# Patient Record
Sex: Male | Born: 1950 | Race: White | Hispanic: No | Marital: Married | Smoking: Never smoker
Health system: Southern US, Community
[De-identification: ages and names within clinical notes are randomized; demographics above are authoritative.]

## PROBLEM LIST (undated history)

## (undated) DIAGNOSIS — K279 Peptic ulcer, site unspecified, unspecified as acute or chronic, without hemorrhage or perforation: Secondary | ICD-10-CM

## (undated) DIAGNOSIS — R7303 Prediabetes: Secondary | ICD-10-CM

## (undated) DIAGNOSIS — H9190 Unspecified hearing loss, unspecified ear: Secondary | ICD-10-CM

## (undated) DIAGNOSIS — I10 Essential (primary) hypertension: Secondary | ICD-10-CM

## (undated) DIAGNOSIS — E669 Obesity, unspecified: Secondary | ICD-10-CM

## (undated) DIAGNOSIS — F329 Major depressive disorder, single episode, unspecified: Secondary | ICD-10-CM

## (undated) DIAGNOSIS — H9319 Tinnitus, unspecified ear: Secondary | ICD-10-CM

## (undated) DIAGNOSIS — M1990 Unspecified osteoarthritis, unspecified site: Secondary | ICD-10-CM

## (undated) DIAGNOSIS — R221 Localized swelling, mass and lump, neck: Secondary | ICD-10-CM

## (undated) DIAGNOSIS — N400 Enlarged prostate without lower urinary tract symptoms: Secondary | ICD-10-CM

## (undated) DIAGNOSIS — E785 Hyperlipidemia, unspecified: Secondary | ICD-10-CM

## (undated) DIAGNOSIS — D649 Anemia, unspecified: Secondary | ICD-10-CM

## (undated) DIAGNOSIS — Z87898 Personal history of other specified conditions: Secondary | ICD-10-CM

## (undated) DIAGNOSIS — Z87448 Personal history of other diseases of urinary system: Secondary | ICD-10-CM

## (undated) DIAGNOSIS — G4733 Obstructive sleep apnea (adult) (pediatric): Secondary | ICD-10-CM

## (undated) DIAGNOSIS — Z862 Personal history of diseases of the blood and blood-forming organs and certain disorders involving the immune mechanism: Secondary | ICD-10-CM

## (undated) DIAGNOSIS — F32A Depression, unspecified: Secondary | ICD-10-CM

## (undated) DIAGNOSIS — K7689 Other specified diseases of liver: Secondary | ICD-10-CM

## (undated) DIAGNOSIS — J309 Allergic rhinitis, unspecified: Secondary | ICD-10-CM

## (undated) DIAGNOSIS — Z973 Presence of spectacles and contact lenses: Secondary | ICD-10-CM

## (undated) DIAGNOSIS — G5600 Carpal tunnel syndrome, unspecified upper limb: Secondary | ICD-10-CM

## (undated) DIAGNOSIS — N210 Calculus in bladder: Secondary | ICD-10-CM

## (undated) HISTORY — PX: COLONOSCOPY: SHX174

## (undated) HISTORY — DX: Tinnitus, unspecified ear: H93.19

## (undated) HISTORY — DX: Hyperlipidemia, unspecified: E78.5

## (undated) HISTORY — DX: Obesity, unspecified: E66.9

## (undated) HISTORY — DX: Major depressive disorder, single episode, unspecified: F32.9

## (undated) HISTORY — DX: Peptic ulcer, site unspecified, unspecified as acute or chronic, without hemorrhage or perforation: K27.9

## (undated) HISTORY — PX: CARPAL TUNNEL RELEASE: SHX101

## (undated) HISTORY — PX: DENTAL SURGERY: SHX609

## (undated) HISTORY — DX: Carpal tunnel syndrome, unspecified upper limb: G56.00

## (undated) HISTORY — DX: Allergic rhinitis, unspecified: J30.9

## (undated) HISTORY — PX: BICEPS TENDON REPAIR: SHX566

## (undated) HISTORY — PX: UPPER GI ENDOSCOPY: SHX6162

## (undated) HISTORY — DX: Obstructive sleep apnea (adult) (pediatric): G47.33

## (undated) HISTORY — PX: TONSILLECTOMY: SUR1361

## (undated) HISTORY — DX: Essential (primary) hypertension: I10

## (undated) HISTORY — DX: Depression, unspecified: F32.A

## (undated) HISTORY — PX: LIPOMA EXCISION: SHX5283

## (undated) HISTORY — PX: ACHILLES TENDON REPAIR: SUR1153

---

## 2004-06-26 ENCOUNTER — Ambulatory Visit (HOSPITAL_COMMUNITY): Admission: RE | Admit: 2004-06-26 | Discharge: 2004-06-26 | Payer: Self-pay | Admitting: Gastroenterology

## 2007-10-13 ENCOUNTER — Emergency Department (HOSPITAL_COMMUNITY): Admission: EM | Admit: 2007-10-13 | Discharge: 2007-10-13 | Payer: Self-pay | Admitting: Emergency Medicine

## 2007-10-16 ENCOUNTER — Ambulatory Visit: Admission: RE | Admit: 2007-10-16 | Discharge: 2007-10-16 | Payer: Self-pay | Admitting: Family Medicine

## 2007-10-16 ENCOUNTER — Encounter (INDEPENDENT_AMBULATORY_CARE_PROVIDER_SITE_OTHER): Payer: Self-pay | Admitting: Family Medicine

## 2007-10-16 ENCOUNTER — Ambulatory Visit (HOSPITAL_COMMUNITY): Admission: RE | Admit: 2007-10-16 | Discharge: 2007-10-16 | Payer: Self-pay | Admitting: Family Medicine

## 2007-10-16 ENCOUNTER — Ambulatory Visit: Payer: Self-pay | Admitting: Vascular Surgery

## 2010-08-18 ENCOUNTER — Emergency Department (INDEPENDENT_AMBULATORY_CARE_PROVIDER_SITE_OTHER): Payer: 59

## 2010-08-18 ENCOUNTER — Emergency Department (HOSPITAL_BASED_OUTPATIENT_CLINIC_OR_DEPARTMENT_OTHER)
Admission: EM | Admit: 2010-08-18 | Discharge: 2010-08-18 | Disposition: A | Discharge status: Home / Self Care | Payer: 59 | Attending: Emergency Medicine | Admitting: Emergency Medicine

## 2010-08-18 DIAGNOSIS — Z79899 Other long term (current) drug therapy: Secondary | ICD-10-CM | POA: Insufficient documentation

## 2010-08-18 DIAGNOSIS — R509 Fever, unspecified: Secondary | ICD-10-CM

## 2010-08-18 DIAGNOSIS — E7800 Pure hypercholesterolemia, unspecified: Secondary | ICD-10-CM | POA: Insufficient documentation

## 2010-08-18 DIAGNOSIS — I10 Essential (primary) hypertension: Secondary | ICD-10-CM | POA: Insufficient documentation

## 2010-08-18 DIAGNOSIS — J0100 Acute maxillary sinusitis, unspecified: Secondary | ICD-10-CM | POA: Insufficient documentation

## 2010-08-18 LAB — URINALYSIS, ROUTINE W REFLEX MICROSCOPIC
Ketones, ur: NEGATIVE mg/dL
Nitrite: NEGATIVE
Protein, ur: NEGATIVE mg/dL
Urine Glucose, Fasting: NEGATIVE mg/dL

## 2010-08-18 LAB — CBC
HCT: 39.6 % (ref 39.0–52.0)
MCH: 31.6 pg (ref 26.0–34.0)
MCV: 88 fL (ref 78.0–100.0)
Platelets: 207 10*3/uL (ref 150–400)
RDW: 13.4 % (ref 11.5–15.5)

## 2010-08-18 LAB — COMPREHENSIVE METABOLIC PANEL
Albumin: 4.4 g/dL (ref 3.5–5.2)
Alkaline Phosphatase: 65 U/L (ref 39–117)
BUN: 21 mg/dL (ref 6–23)
Chloride: 105 mEq/L (ref 96–112)
Creatinine, Ser: 1.1 mg/dL (ref 0.4–1.5)
Glucose, Bld: 117 mg/dL — ABNORMAL HIGH (ref 70–99)
Potassium: 3.8 mEq/L (ref 3.5–5.1)
Total Bilirubin: 1.3 mg/dL — ABNORMAL HIGH (ref 0.3–1.2)
Total Protein: 7.7 g/dL (ref 6.0–8.3)

## 2010-08-18 LAB — URINE MICROSCOPIC-ADD ON

## 2010-08-18 LAB — LACTIC ACID, PLASMA: Lactic Acid, Venous: 1 mmol/L (ref 0.5–2.2)

## 2010-08-18 LAB — DIFFERENTIAL
Eosinophils Absolute: 0 10*3/uL (ref 0.0–0.7)
Eosinophils Relative: 0 % (ref 0–5)
Lymphocytes Relative: 17 % (ref 12–46)
Lymphs Abs: 1.9 10*3/uL (ref 0.7–4.0)
Monocytes Relative: 9 % (ref 3–12)

## 2010-08-20 LAB — URINE CULTURE
Colony Count: NO GROWTH
Culture  Setup Time: 201202040136
Culture: NO GROWTH

## 2010-08-25 LAB — CULTURE, BLOOD (ROUTINE X 2)
Culture  Setup Time: 201202040151
Culture: NO GROWTH

## 2010-12-01 NOTE — Op Note (Signed)
NAMEFERMAN, BASILIO               ACCOUNT NO.:  [id number]   MEDICAL RECORD NO.:  [id number]          PATIENT TYPE:  AMB   LOCATION:  ENDO                         FACILITY:  [facility]   PHYSICIAN:  Graylin Shiver, M.D.   DATE OF BIRTH:  04-26-1951   DATE OF PROCEDURE:  06/26/2004  DATE OF DISCHARGE:                                 OPERATIVE REPORT   PROCEDURE:  Colonoscopy.   INDICATIONS:  Screening.   Informed consent was obtained after explanation of the risks of bleeding,  infection, and perforation.   PREMEDICATION:  Fentanyl 75 mcg IV, Versed 6 mg IV.   PROCEDURE:  With the patient in the left lateral decubitus position, a  rectal exam was performed.  No masses were felt.  The Olympus colonoscope  was inserted into the rectum and advanced around the colon to the cecum.  Cecal landmarks were identified.  The cecum and ascending colon were normal,  the transverse colon normal, the descending colon, sigmoid, and rectum were  normal.  He tolerated the procedure well without complications.   IMPRESSION:  Normal colonoscopy to the cecum.   I would recommend a follow-up colonoscopy again in 10 years.      SFG/MEDQ  D:  06/26/2004  T:  06/26/2004  Job:  [id number]   cc:   Tally Joe, M.D.  [address]  [place], [place] [address]  Fax: [phone]

## 2011-02-14 ENCOUNTER — Other Ambulatory Visit: Payer: Self-pay

## 2011-02-23 ENCOUNTER — Ambulatory Visit
Admission: RE | Admit: 2011-02-23 | Discharge: 2011-02-23 | Disposition: A | Discharge status: Home / Self Care | Payer: 59 | Source: Ambulatory Visit

## 2011-04-09 LAB — CBC
HCT: 49
Platelets: 261
RDW: 13.2
WBC: 8.3

## 2011-04-09 LAB — ETHANOL: Alcohol, Ethyl (B): <5

## 2011-04-09 LAB — DIFFERENTIAL
Basophils Absolute: 0
Eosinophils Absolute: 0.1
Eosinophils Relative: 1
Lymphocytes Relative: 25
Neutrophils Relative %: 65

## 2011-04-09 LAB — BASIC METABOLIC PANEL
BUN: 13
Creatinine, Ser: 1.18
GFR calc non Af Amer: >60
Glucose, Bld: 115 — ABNORMAL HIGH
Potassium: 4.1

## 2011-04-09 LAB — RAPID URINE DRUG SCREEN, HOSP PERFORMED
Barbiturates: NOT DETECTED
Benzodiazepines: NOT DETECTED
Opiates: NOT DETECTED

## 2013-04-15 ENCOUNTER — Other Ambulatory Visit: Payer: Self-pay | Admitting: General Surgery

## 2013-04-15 ENCOUNTER — Telehealth: Payer: Self-pay | Admitting: Cardiology

## 2013-04-15 NOTE — Telephone Encounter (Signed)
New problem   Pt was referred to a dentist for oral procedure that wasn't in his network. Pleas call pt.

## 2013-04-15 NOTE — Telephone Encounter (Signed)
Left patient a message to call back.

## 2013-04-16 NOTE — Telephone Encounter (Signed)
Spoke with pt and he is willing to stay with the out of network Dentist.

## 2013-12-17 ENCOUNTER — Encounter: Payer: Self-pay | Admitting: *Deleted

## 2013-12-21 ENCOUNTER — Encounter: Payer: Self-pay | Admitting: Neurology

## 2013-12-21 ENCOUNTER — Ambulatory Visit (INDEPENDENT_AMBULATORY_CARE_PROVIDER_SITE_OTHER): Payer: 59 | Admitting: Neurology

## 2013-12-21 VITALS — BP 110/78 | HR 74 | Ht 66.14 in | Wt 200.2 lb

## 2013-12-21 DIAGNOSIS — R209 Unspecified disturbances of skin sensation: Secondary | ICD-10-CM

## 2013-12-21 DIAGNOSIS — G5712 Meralgia paresthetica, left lower limb: Secondary | ICD-10-CM

## 2013-12-21 DIAGNOSIS — G5710 Meralgia paresthetica, unspecified lower limb: Secondary | ICD-10-CM

## 2013-12-21 MED ORDER — LIDOCAINE 5 % EX OINT: TOPICAL_OINTMENT | CUTANEOUS | Status: DC

## 2013-12-21 NOTE — Progress Notes (Signed)
Note faxed.

## 2013-12-21 NOTE — Progress Notes (Signed)
[facility] Clinic Note - Initial Visit   Date: 26/83/4196  Hunter Stone MRN: [id number] DOB: 1950-12-09   Dear Dr Moreen Fowler:   Thank you for your kind referral of Hunter Stone for consultation of paresthesias of arm and legs. Although his history is well known to you, please allow [place] to reiterate it for the purpose of our medical record. The patient was accompanied to the clinic by self.    History of Present Illness: Hunter Stone is a 63 y.o. right-handed Caucasian male with history of hypertension, hyperlipidemia, OSA (intolerant of CPAP), PUD, and depression presenting for evaluation of left meralgia paresthetica and numbness/tingling of the arms.  In early 2014, he developed numbness/tingling of the left anterolateral thigh.  Symptoms were constant and improved within a year and returned again in Spring 2015. He takes Aleve as needed which does not help.  He reports gaining about 3-4 lb in the past 39-months.  Denies wearing tight constrictive clothing.  He does endorse carrying his cell phone on his belt on the right side.    Further, over the past year, he feel as if his arms falls asleep, worse on the right side and at night.  Pain is characterized as shooting and involves the entire arm from the shoulder into the fingers.  He has tried sleeping in different positions, which does not make much of a difference.  He endorses mild neck and shoulder for which he saw a chiropractor, which has since improved.  He has been on lipitor for a number of years which was stopped in May.  Since stopping his statin, his arm pain has significantly improved.  He denied have muscle soreness or weakness, but arms were very painful.    Out-side paper records, electronic medical record, and images have been reviewed where available and summarized as:  Labs 10/20/2013:  HbA1c 6.1, Cr 1.10, BUN 17, AST 29, ALT 42  MRI head wo contrast 10/17/2007: No acute or reversible  process. Changes of small vessel disease in the hemispheric white matter bilaterally, advanced for a patient of this age and likely related to the patient's hypertension.    Past Medical History  Diagnosis Date  . Hypertension   . OSA (obstructive sleep apnea)   . Obesity   . Peptic ulcer disease   . Tinnitus   . Allergic rhinitis   . Depression   . Dyslipidemia     Past Surgical History  Procedure Laterality Date  . Achilles tendon repair Left      Medications:  Current Outpatient Prescriptions on File Prior to Visit  Medication Sig Dispense Refill  . aspirin 81 MG tablet Take by mouth daily.      . cetirizine (ZYRTEC) 10 MG tablet Take 10 mg by mouth daily.      . cholecalciferol (VITAMIN D) 1000 UNITS tablet Take 1,000 Units by mouth daily.      . Cinnamon 500 MG capsule Take 500 mg by mouth daily.      Marland Kitchen co-enzyme Q-10 30 MG capsule Take 30 mg by mouth 3 (three) times daily.      Marland Kitchen olmesartan-hydrochlorothiazide (BENICAR HCT) 20-12.5 MG per tablet Take 1 tablet by mouth daily.      . Omega-3 Fatty Acids (FISH OIL) 1000 MG CPDR Take by mouth.      . Probiotic Product (PROBIOTIC DAILY PO) Take by mouth.       No current facility-administered medications on file prior to visit.  Allergies: No Known Allergies  Family History: Family History  Problem Relation Age of Onset  . Huntington's disease Father     Deceased, 75 Hunter Stone tested negative  . Lung cancer Mother     Deceased 29  . Heart attack Paternal Grandfather   . Coronary artery disease Paternal Grandfather   . Heart attack Maternal Grandfather   . Coronary artery disease Maternal Grandfather   . Healthy Brother     Tested negative for Huntington's    Social History: History   Social History  . Marital Status: Married    Spouse Name: N/A    Number of Children: 3  . Years of Education: N/A   Occupational History  .  [place] History Main Topics  . Smoking status: Never Smoker   .  Smokeless tobacco: Not on file  . Alcohol Use: Yes     Comment: 2 drinks per night (wine, liquor, beer)  . Drug Use: No  . Sexual Activity: Not on file   Other Topics Concern  . Not on file   Social History Narrative   He is a retired Estate manager/land agent.   Lives with wife and son.          Review of Systems:  CONSTITUTIONAL: No fevers, chills, night sweats, or weight loss.   EYES: No visual changes or eye pain ENT: No hearing changes.  No history of nose bleeds.   RESPIRATORY: No cough, wheezing and shortness of breath.   CARDIOVASCULAR: Negative for chest pain, and palpitations.   GI: Negative for abdominal discomfort, blood in stools or black stools.  No recent change in bowel habits.   GU:  No history of incontinence.   MUSCLOSKELETAL: No history of joint pain or swelling.  No myalgias.   SKIN: Negative for lesions, rash, and itching.   HEMATOLOGY/ONCOLOGY: Negative for prolonged bleeding, bruising easily, and swollen nodes.  No history of cancer.   ENDOCRINE: Negative for cold or heat intolerance, polydipsia or goiter.   PSYCH:  No depression or anxiety symptoms.   NEURO: As Above.   Vital Signs:  BP 110/78  Pulse 74  Ht 5' 6.14" (1.68 m)  Wt 200 lb 3 oz (90.804 kg)  BMI 32.17 kg/m2  SpO2 97% Pain Scale: 0 on a scale of 0-10   General Medical Exam:   General:  Well appearing, comfortable.   Eyes/ENT: see cranial nerve examination.   Neck: No masses appreciated.  Full range of motion without tenderness.  No carotid bruits. Respiratory:  Clear to auscultation, good air entry bilaterally.   Cardiac:  Regular rate and rhythm, no murmur.   Extremities:  No deformities, edema, or skin discoloration. Good capillary refill.   Skin:  Skin color, texture, turgor normal. No rashes or lesions.  Neurological Exam: MENTAL STATUS including orientation to time, place, person, recent and remote memory, attention span and concentration, language, and fund of knowledge is  normal.  Speech is not dysarthric.  CRANIAL NERVES: II:  No visual field defects.  Unremarkable fundi.   III-IV-VI: Pupils equal round and reactive to light.  Normal conjugate, extra-ocular eye movements in all directions of gaze.  No nystagmus.  No ptosis.   V:  Normal facial sensation.  VII:  Normal facial symmetry and movements.    VIII:  Normal hearing and vestibular function.   IX-X:  Normal palatal movement.   XI:  Normal shoulder shrug and head rotation.   XII:  Normal tongue strength and range  of motion, no deviation or fasciculation.  MOTOR:  No atrophy, fasciculations or abnormal movements. No pain to muscle palpation. No pronator drift.  Tone is normal.    Right Upper Extremity:    Left Upper Extremity:    Deltoid  5/5   Deltoid  5/5   Biceps  5/5   Biceps  5/5   Triceps  5/5   Triceps  5/5   Wrist extensors  5/5   Wrist extensors  5/5   Wrist flexors  5/5   Wrist flexors  5/5   Finger extensors  5/5   Finger extensors  5/5   Finger flexors  5/5   Finger flexors  5/5   Dorsal interossei  5/5   Dorsal interossei  5/5   Abductor pollicis  5/5   Abductor pollicis  5/5   Tone (Ashworth scale)  0  Tone (Ashworth scale)  0   Right Lower Extremity:    Left Lower Extremity:    Hip flexors  5/5   Hip flexors  5/5   Hip extensors  5/5   Hip extensors  5/5   Knee flexors  5/5   Knee flexors  5/5   Knee extensors  5/5   Knee extensors  5/5   Dorsiflexors  5/5   Dorsiflexors  5/5   Plantarflexors  5/5   Plantarflexors  5/5   Toe extensors  5/5   Toe extensors  5/5   Toe flexors  5/5   Toe flexors  5/5   Tone (Ashworth scale)  0  Tone (Ashworth scale)  0   MSRs:  Right                                                                 Left brachioradialis 2+  brachioradialis 2+  biceps 2+  biceps 2+  triceps 2+  triceps 2+  patellar 2+  patellar 2+  ankle jerk 2+  ankle jerk 2+  Hoffman no  Hoffman no  plantar response down  plantar response down   SENSORY:  Reduced  temperature, pin prick, and light touch over the anterolateral thigh on the left, otherwise normal and symmetric sensory exam of the extremities.  Rhomberg is negative.  COORDINATION/GAIT: Normal finger-to- nose-finger and heel-to-shin.  Intact rapid alternating movements bilaterally.  Able to rise from a chair without using arms.  Gait narrow based and stable. Tandem and stressed gait intact.    IMPRESSION: Hunter Stone is a 63 year-old gentleman presenting for left thigh numbness and episodic paresthesias of the hands.  His examination is consistent with diminished sensation in all modalities over the anterolateral thigh on the left side. 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 Management is conservative with NSAIDs and avoidance of repetitive trauma to this region, specifically minimize wearing his cell phone case on the left belt.  I have discussed the diagnosis, pathophysiology, management plan, and risks and benefits of medications, and prognosis.   Additionally, he had whole arm paresthesias involving the right > left arm which has improved since stopping lipitor. Usually statin cause myalgias and its relationship causing neuropathy is still controversial as there is no data to support this.  If his symptoms worsen, NCS/EMG of the right arm can be performed to help differentiate cervical radiculopathy vs nerve entrapment (CTS, ulnar neuropathy).  Since he is clinically doing well, he would like to see how symptoms evolve over time which is reasonable.   PLAN/RECOMMENDATIONS:  1.  Apply lidocaine ointment to left thigh as needed 2.  Avoid tight fitting clothing, belts, and carrying your cell phone on the belt 3.  Weight loss encouraged 4.  Consider EMG of right arm if symptoms worsen 5.  Return to clinic as needed   The duration of this appointment visit was 45 minutes of face-to-face time  with the patient.  Greater than 50% of this time was spent in counseling, explanation of diagnosis, planning of further management, and coordination of care.   Thank you for allowing me to participate in patient's care.  If I can answer any additional questions, I would be pleased to do so.    Sincerely,    Donika K. Posey Pronto, DO

## 2013-12-21 NOTE — Patient Instructions (Signed)
1.  Apply lidocaine ointment to left thigh as needed 2.  Avoid tight fitting clothing or belts.  Also try to avoid carrying your cell phone on your belt 4.  Weight loss encouraged 5.  Return to clinic as needed, especially if symptoms worsen

## 2014-04-30 ENCOUNTER — Other Ambulatory Visit: Payer: Self-pay | Admitting: Family Medicine

## 2014-04-30 DIAGNOSIS — M5441 Lumbago with sciatica, right side: Secondary | ICD-10-CM

## 2014-05-04 ENCOUNTER — Ambulatory Visit
Admission: RE | Admit: 2014-05-04 | Discharge: 2014-05-04 | Disposition: A | Discharge status: Home / Self Care | Payer: 59 | Source: Ambulatory Visit | Attending: Family Medicine | Admitting: Family Medicine

## 2014-05-04 DIAGNOSIS — M5441 Lumbago with sciatica, right side: Secondary | ICD-10-CM

## 2014-05-27 ENCOUNTER — Telehealth: Payer: Self-pay | Admitting: Neurology

## 2014-05-27 NOTE — Telephone Encounter (Signed)
Pt resch appt from 05-28-14 to 05-31-14

## 2014-05-28 ENCOUNTER — Ambulatory Visit: Payer: 59 | Admitting: Neurology

## 2014-05-31 ENCOUNTER — Ambulatory Visit (INDEPENDENT_AMBULATORY_CARE_PROVIDER_SITE_OTHER): Payer: 59 | Admitting: Neurology

## 2014-05-31 ENCOUNTER — Encounter: Payer: Self-pay | Admitting: Neurology

## 2014-05-31 VITALS — BP 110/70 | HR 80 | Ht 66.0 in | Wt 186.2 lb

## 2014-05-31 DIAGNOSIS — M5431 Sciatica, right side: Secondary | ICD-10-CM

## 2014-05-31 DIAGNOSIS — M5412 Radiculopathy, cervical region: Secondary | ICD-10-CM

## 2014-05-31 DIAGNOSIS — R209 Unspecified disturbances of skin sensation: Secondary | ICD-10-CM

## 2014-05-31 MED ORDER — GABAPENTIN 300 MG PO CAPS: 300.0000 mg | ORAL_CAPSULE | Freq: Every day | ORAL | Status: DC

## 2014-05-31 NOTE — Patient Instructions (Signed)
1.  Start gabapentin 300mg  at bedtime  2.  Physical therapy for leg stretching 3.  EMG left arm and leg 4.  Return to clinic 42-months

## 2014-05-31 NOTE — Progress Notes (Signed)
Follow-up Visit   UTML:46/50/3546   Hunter Stone MRN: [id number] DOB: May 14, 1951   Interim History: Hunter Stone is a 63 y.o. right-handed Caucasian male with history of hypertension, hyperlipidemia, OSA (intolerant of CPAP), PUD, and depression presenting for evaluation of right arm and leg shooting pain.  History of present illness: In early 2014, he developed numbness/tingling of the left anterolateral thigh. Symptoms were constant and improved within a year and returned again in Spring 2015. He takes Aleve as needed which does not help. He reports gaining about 3-4 lb in the past 39-months. Denies wearing tight constrictive clothing. He does endorse carrying his cell phone on his belt on the right side.   Further, over the past year, he feel as if his arms falls asleep, worse on the right side and at night. Pain is characterized as shooting and involves the entire arm from the shoulder into the fingers. He has tried sleeping in different positions, which does not make much of a difference. He endorses mild neck and shoulder for which he saw a chiropractor, which has since improved. He has been on lipitor for a number of years which was stopped in May. Since stopping his statin, his arm pain has significantly improved. He denied have muscle soreness or weakness, but arms were very painful.   UPDATE 05/31/2014:  Around 40-months ago, he developed right leg shooting pain radiating down his leg.  It is worse sitting and alleviates with stretching.   He also reports to having right shoulder pain which had initially improved after stopping his statin, however he reports he has returned and is worse. Pain is characterized as shooting and radiates from his shoulders into the fingers.he has tried taking Aleve, however denies any significant benefit.MRI of the lumbar spine was obtained on his lower extremity paresthesias and did not show any nerve impingement.  Meralgia  paresthetica is slihglty improved.     Medications:  Current Outpatient Prescriptions on File Prior to Visit  Medication Sig Dispense Refill  . aspirin 81 MG tablet Take by mouth daily.    . cetirizine (ZYRTEC) 10 MG tablet Take 10 mg by mouth daily.    . cholecalciferol (VITAMIN D) 1000 UNITS tablet Take 1,000 Units by mouth daily.    . Cinnamon 500 MG capsule Take 500 mg by mouth daily.    Marland Kitchen co-enzyme Q-10 30 MG capsule Take 30 mg by mouth 3 (three) times daily.    Marland Kitchen lidocaine (XYLOCAINE) 5 % ointment Apply to left thigh as needed.  Wear gloves when using. 30 g 0  . olmesartan-hydrochlorothiazide (BENICAR HCT) 20-12.5 MG per tablet Take 1 tablet by mouth daily.    . Omega-3 Fatty Acids (FISH OIL) 1000 MG CPDR Take by mouth.    . Probiotic Product (PROBIOTIC DAILY PO) Take by mouth.     No current facility-administered medications on file prior to visit.    Allergies: No Known Allergies   Review of Systems:  CONSTITUTIONAL: No fevers, chills, night sweats, + weight loss.   EYES: No visual changes or eye pain ENT: No hearing changes.  No history of nose bleeds.   RESPIRATORY: No cough, wheezing and shortness of breath.   CARDIOVASCULAR: Negative for chest pain, and palpitations.   GI: Negative for abdominal discomfort, blood in stools or black stools.  No recent change in bowel habits.   GU:  No history of incontinence.   MUSCLOSKELETAL: No history of joint pain or swelling.  No myalgias.  SKIN: Negative for lesions, rash, and itching.   ENDOCRINE: Negative for cold or heat intolerance, polydipsia or goiter.   PSYCH:  No depression or anxiety symptoms.   NEURO: As Above.   Vital Signs:  BP 110/70 mmHg  Pulse 80  Ht 5\' 6"  (1.676 m)  Wt 186 lb 4 oz (84.482 kg)  BMI 30.08 kg/m2  SpO2 98%  Neurological Exam: MENTAL STATUS including orientation to time, place, person, recent and remote memory, attention span and concentration, language, and fund of knowledge is normal.   Speech is not dysarthric.  CRANIAL NERVES: Pupils equal round and reactive to light.  Normal conjugate, extra-ocular eye movements in all directions of gaze.  No ptosis. Face is symmetric. Palate elevates symmetrically.  Tongue is midline.  MOTOR:  Motor strength is 5/5 in all extremities.  No pronator drift.  Tone is normal.    MSRs:  Reflexes are 2+/4 throughout.  SENSORY:  Intact to vibration and light touch throughout.  COORDINATION/GAIT:  Gait narrow based and stable.   Data: Labs 10/20/2013: HbA1c 6.1, Cr 1.10, BUN 17, AST 29, ALT 42  MRI head wo contrast 10/17/2007: No acute or reversible process. Changes of small vessel disease in the hemispheric white matter bilaterally, advanced for a patient of this age and likely related to the patient's hypertension.  MRI lumbar spine wo contrast 05/04/2014: Mild lumbar degenerative change without neural impingement T12 bone lesion on the right is well circumscribed most likely benign such as hemangioma. Findings suggestive of chronic bladder outlet obstruction.   IMPRESSION/PLAN: 1. Right arm paresthesias with radicular pain, likely due to cervical radiculopathy  - Electrodiagnostic testing to better characterize the nature of his symptoms  - Start gabapentin 300 mg at bedtime, up-titrate as needed  2.  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  Left meralgia paresthetica, improving  - He reports losing 20lbs this fall which has helped  4.  Return to clinic in 2 months   The duration of this appointment visit was 25 minutes of face-to-face time with the patient.  Greater than 50% of this time was spent in counseling, explanation of diagnosis, planning of further management, and coordination of care.   Thank you for allowing me to participate in patient's care.   If I can answer any additional questions, I would be pleased to do so.    Sincerely,    Lauraine Crespo K. Posey Pronto, DO

## 2014-06-02 ENCOUNTER — Telehealth: Payer: Self-pay | Admitting: Neurology

## 2014-06-02 NOTE — Telephone Encounter (Signed)
Pt calling as requested with med update. Please call back at confirmed CB# [phone] / Sherri S.

## 2014-06-02 NOTE — Telephone Encounter (Signed)
Patient given instructions to increase gabapentin and will report back to [place] on how it is working.  Also gave him appointment to [facility].  December 4 at 12:45 with Dr. Doran Durand.  Notes faxed.

## 2014-06-02 NOTE — Telephone Encounter (Signed)
Yes, if it is not making him too sleepy, he can increase to 300 mg 3 times daily.

## 2014-06-02 NOTE — Telephone Encounter (Signed)
Patient called back to report that he is having more pain in his wrist and hand.  He is on Gabapentin 300 mg qhs.  Would you like to increase dose?

## 2014-06-21 NOTE — Telephone Encounter (Signed)
Patient called to clarify why we sent him to PT.  Informed him that it is for leg stretching.

## 2014-07-07 ENCOUNTER — Other Ambulatory Visit: Payer: Self-pay | Admitting: *Deleted

## 2014-07-07 ENCOUNTER — Telehealth: Payer: Self-pay | Admitting: Neurology

## 2014-07-07 ENCOUNTER — Ambulatory Visit (INDEPENDENT_AMBULATORY_CARE_PROVIDER_SITE_OTHER): Payer: 59 | Admitting: Neurology

## 2014-07-07 DIAGNOSIS — M5412 Radiculopathy, cervical region: Secondary | ICD-10-CM

## 2014-07-07 DIAGNOSIS — R209 Unspecified disturbances of skin sensation: Secondary | ICD-10-CM

## 2014-07-07 DIAGNOSIS — G5601 Carpal tunnel syndrome, right upper limb: Secondary | ICD-10-CM

## 2014-07-07 DIAGNOSIS — M5431 Sciatica, right side: Secondary | ICD-10-CM

## 2014-07-07 MED ORDER — GABAPENTIN 300 MG PO CAPS: ORAL_CAPSULE | ORAL | Status: DC

## 2014-07-07 NOTE — Telephone Encounter (Signed)
Rx sent to the pharmacy 300mg  (x) 90 with 3 refills pre Dr. Posey Pronto to increase

## 2014-07-07 NOTE — Telephone Encounter (Signed)
Pt needs to have some gabapentin called into the [facility] [address] he states that we changed his doseage and he is out his phone number is [phone]

## 2014-07-07 NOTE — Procedures (Signed)
[facility]  [address]  [place], [place] [address] Tel: [phone] Fax:  [phone] Test Date:  07/07/2014  Patient: Hunter Stone DOB: [id number] Physician: Narda Amber  Sex: Male Height: 5\' 6"  Ref Phys: Narda Amber  ID#: [id number]   Technician: Laureen Ochs R. NCS T.   Patient Complaints: Patient is a 63 year old male here for evaluation of symptoms of radicular pain in the right arm with paresthesias and in the right leg.  NCV & EMG Findings: Extensive electrodiagnostic testing of the right upper and lower extremity shows:  1. Median sensory response is markedly reduced and prolonged. The ulnar and radial sensory responses are within normal limits. 2. The median motor response shows prolonged latency with preserved amplitude. The ulnar motor responses within normal limits. 3. Sural and superficial peroneal sensory responses are within normal limits. 4. Peroneal and tibial motor responses are within normal limits. 5. In the arms, there is no evidence of active or chronic motor axon loss changes affecting any of the tested muscles.  6. In the legs, sparse chronic motor axon loss changes are seen affecting the tibialis anterior, medial gastrocnemius, biceps femoris short head, and semi-member gnosis muscles. There is no evidence of active changes.  Impression: 1. Right median neuropathy, at or distal to the wrist consistent with the clinical diagnosis of carpal tunnel syndrome. Overall, these findings are moderate to severe in degree electrically. 2. 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 Definitive diagnosis cannot be made as sensory responses are preserved in the lower extremity.    ___________________________ Narda Amber    Nerve Conduction Studies Anti Sensory Summary Table   Stim Site NR Peak  (ms) Norm Peak (ms) P-T Amp (V) Norm P-T Amp  Right Median Anti Sensory (2nd Digit)  32C  Wrist    5.4 <3.8 6.7 >10  Right Radial Anti Sensory (Base 1st Digit)  32C  Wrist    2.0 <2.8 28.5 >10  Right Sup Peroneal Anti Sensory (Ant Lat Mall)  32C  12 cm    2.8 <4.6 9.8 >3  Right Sural Anti Sensory (Lat Mall)  32C  Calf    3.7 <4.6 12.4 >3  Right Ulnar Anti Sensory (5th Digit)  32C  Wrist    2.4 <3.2 24.5 >5   Motor Summary Table   Stim Site NR Onset (ms) Norm Onset (ms) O-P Amp (mV) Norm O-P Amp Site1 Site2 Delta-0 (ms) Dist (cm) Vel (m/s) Norm Vel (m/s)  Right Median Motor (Abd Poll Brev)  32C  Wrist    5.5 <4.0 8.5 >5 Elbow Wrist 4.6 24.5 53 >50  Elbow    10.1  8.3         Right Peroneal Motor (Ext Dig Brev)  32C  Ankle    3.4 <6.0 5.7 >2.5 B Fib Ankle 6.7 30.0 45 >40  B Fib    10.1  5.1  Poplt B Fib 1.5 7.0 47 >40  Poplt    11.6  4.9         Right Peroneal TA Motor (Tib Ant)  32C  Fib Head    1.9 <4.5 11.0 >3 Poplit Fib Head 1.9 8.0 42 >40  Poplit    3.8  10.8         Right Tibial Motor (Abd Hall Brev)  32C  Ankle    4.0 <6.0  10.8 >4 Knee Ankle 8.3 37.0 45 >40  Knee    12.3  9.3         Right Ulnar Motor (Abd Dig Minimi)  32C  Wrist    2.2 <3.1 12.0 >7 B Elbow Wrist 4.1 23.0 56 >50  B Elbow    6.3  11.2  A Elbow B Elbow 2.0 10.0 50 >50  A Elbow    8.3  10.7          F Wave Studies   NR F-Lat (ms) Lat Norm (ms) L-R F-Lat (ms)  Right Tibial (Mrkrs) (Abd Hallucis)  32C     44.43 <55   Right Ulnar (Mrkrs) (Abd Dig Min)  32C     28.38 <33    H Reflex Studies   NR H-Lat (ms) Lat Norm (ms) L-R H-Lat (ms)  Left Tibial (Gastroc)  32C     32.93 <35 0.00  Right Tibial (Gastroc)  32C     32.93 <35 0.00   EMG   Side Muscle Ins Act Fibs Psw Fasc Number Recrt Dur Dur. Amp Amp. Poly Poly. Comment  Right 1stDorInt Nml Nml Nml Nml Nml Nml Nml Nml Nml Nml Nml Nml N/A  Right Abd Poll Brev Nml Nml Nml Nml Nml Nml Nml Nml Nml Nml Nml Nml N/A  Right FlexPolLong Nml Nml  Nml Nml Nml Nml Nml Nml Nml Nml Nml Nml N/A  Right PronatorTeres Nml Nml Nml Nml Nml Nml Nml Nml Nml Nml Nml Nml N/A  Right Biceps Nml Nml Nml Nml Nml Nml Nml Nml Nml Nml Nml Nml N/A  Right Triceps Nml Nml Nml Nml Nml Nml Nml Nml Nml Nml Nml Nml N/A  Right Deltoid Nml Nml Nml Nml Nml Nml Nml Nml Nml Nml Nml Nml N/A  Right AntTibialis Nml Nml Nml Nml Nml Nml Nml Nml Nml Nml Nml Nml N/A  Right Gastroc Nml Nml Nml Nml 1- Mod-R Some 1+ Few 1+ Nml Nml N/A  Right Flex Dig Long Nml Nml Nml Nml 1- Mod-R Few 1+ Few 1+ Some 1+ N/A  Right RectFemoris Nml Nml Nml Nml Nml Nml Nml Nml Nml Nml Nml Nml N/A  Right GluteusMed Nml Nml Nml Nml Nml Nml Nml Nml Nml Nml Nml Nml N/A  Right Semimembranosus Nml Nml Nml Nml 1- Mod-R Few 1+ Nml Nml Nml Nml N/A  Right BicepsFemS Nml Nml Nml Nml 1- Mod Few 1+ Nml Nml Nml Nml N/A      Waveforms:

## 2014-07-08 MED ORDER — GABAPENTIN 300 MG PO CAPS: 600.0000 mg | ORAL_CAPSULE | Freq: Two times a day (BID) | ORAL | Status: DC

## 2014-07-08 NOTE — Telephone Encounter (Signed)
Actually, let's increase to neurontin 600mg  BID.   Hunter Hanak K. Posey Pronto, DO

## 2014-07-08 NOTE — Telephone Encounter (Signed)
Patient notified

## 2014-08-10 ENCOUNTER — Ambulatory Visit: Payer: 59 | Admitting: Neurology

## 2014-08-24 NOTE — Telephone Encounter (Signed)
error 

## 2014-09-01 ENCOUNTER — Ambulatory Visit (INDEPENDENT_AMBULATORY_CARE_PROVIDER_SITE_OTHER): Payer: 59 | Admitting: Neurology

## 2014-09-01 ENCOUNTER — Encounter: Payer: Self-pay | Admitting: Neurology

## 2014-09-01 VITALS — BP 118/80 | HR 74 | Ht 66.0 in | Wt 192.5 lb

## 2014-09-01 DIAGNOSIS — G5600 Carpal tunnel syndrome, unspecified upper limb: Secondary | ICD-10-CM | POA: Insufficient documentation

## 2014-09-01 DIAGNOSIS — G5601 Carpal tunnel syndrome, right upper limb: Secondary | ICD-10-CM

## 2014-09-01 DIAGNOSIS — G5712 Meralgia paresthetica, left lower limb: Secondary | ICD-10-CM

## 2014-09-01 HISTORY — DX: Carpal tunnel syndrome, unspecified upper limb: G56.00

## 2014-09-01 NOTE — Patient Instructions (Signed)
We will send a referral to [facility] for evaluation of carpal tunnel syndrome Please call my office for an appointment if your symptoms worsen

## 2014-09-01 NOTE — Progress Notes (Signed)
Follow-up Visit   TOIZ:12/45/8099   CHAZE HRUSKA MRN: [id number] DOB: May 23, 1951   Interim History: Hunter Stone is a 64 y.o. right-handed Caucasian male with history of hypertension, hyperlipidemia, OSA (intolerant of CPAP), PUD, and depression presenting for evaluation of right carpal tunnel syndrome.  History of present illness: In early 2014, he developed numbness/tingling of the left anterolateral thigh. Symptoms were constant and improved within a year and returned again in Spring 2015. He takes Aleve as needed which does not help. He reports gaining about 3-4 lb in the past 2-months. Denies wearing tight constrictive clothing. He does endorse carrying his cell phone on his belt on the right side.   Further, over the past year, he feel as if his arms falls asleep, worse on the right side and at night. Pain is characterized as shooting and involves the entire arm from the shoulder into the fingers. He has tried sleeping in different positions, which does not make much of a difference. He endorses mild neck and shoulder for which he saw a chiropractor, which has since improved. He has been on lipitor for a number of years which was stopped in May. Since stopping his statin, his arm pain has significantly improved. He denied have muscle soreness or weakness, but arms were very painful.   UPDATE 05/31/2014:  Around 7-months ago, he developed right leg shooting pain radiating down his leg.  It is worse sitting and alleviates with stretching.   He also reports to having right shoulder pain which had initially improved after stopping his statin, however he reports he has returned and is worse. Pain is characterized as shooting and radiates from his shoulders into the fingers.he has tried taking Aleve, however denies any significant benefit.MRI of the lumbar spine was obtained on his lower extremity paresthesias and did not show any nerve impingement.  UPDATE 09/01/2014:   Since his past visit, patient underwent EMG which showed moderate to severe right carpal tunnel syndrome.  He reports to having improvement of his right hand paresthesias since using a different wrist splint.  He has been able reduce his gabapentin to 300mg  at bedtime because painful paresthesias has improved, but constant numbness is unchanged.    Meralgia paresthetica and left leg discomfort is stable.  Medications:  Current Outpatient Prescriptions on File Prior to Visit  Medication Sig Dispense Refill  . aspirin 81 MG tablet Take by mouth daily.    . cetirizine (ZYRTEC) 10 MG tablet Take 10 mg by mouth daily.    . cholecalciferol (VITAMIN D) 1000 UNITS tablet Take 1,000 Units by mouth daily.    . Cinnamon 500 MG capsule Take 500 mg by mouth daily.    Marland Kitchen co-enzyme Q-10 30 MG capsule Take 30 mg by mouth 3 (three) times daily.    Marland Kitchen gabapentin (NEURONTIN) 300 MG capsule Take 2 capsules (600 mg total) by mouth 2 (two) times daily. (Patient taking differently: Take 300 mg by mouth at bedtime. ) 120 capsule 5  . lidocaine (XYLOCAINE) 5 % ointment Apply to left thigh as needed.  Wear gloves when using. 30 g 0  . olmesartan-hydrochlorothiazide (BENICAR HCT) 20-12.5 MG per tablet Take 1 tablet by mouth daily.    . Omega-3 Fatty Acids (FISH OIL) 1000 MG CPDR Take by mouth.    . Probiotic Product (PROBIOTIC DAILY PO) Take by mouth.    . rosuvastatin (CRESTOR) 10 MG tablet Take 10 mg by mouth daily.     No current facility-administered medications  on file prior to visit.    Allergies: No Known Allergies   Review of Systems:  CONSTITUTIONAL: No fevers, chills, night sweats, + weight loss.   EYES: No visual changes or eye pain ENT: No hearing changes.  No history of nose bleeds.   RESPIRATORY: No cough, wheezing and shortness of breath.   CARDIOVASCULAR: Negative for chest pain, and palpitations.   GI: Negative for abdominal discomfort, blood in stools or black stools.  No recent change in bowel  habits.   GU:  No history of incontinence.   MUSCLOSKELETAL: No history of joint pain or swelling.  No myalgias.   SKIN: Negative for lesions, rash, and itching.   ENDOCRINE: Negative for cold or heat intolerance, polydipsia or goiter.   PSYCH:  No depression or anxiety symptoms.   NEURO: As Above.   Vital Signs:  BP 118/80 mmHg  Pulse 74  Ht 5\' 6"  (1.676 m)  Wt 192 lb 8 oz (87.317 kg)  BMI 31.08 kg/m2  SpO2 94%  Neurological Exam: MENTAL STATUS including orientation to time, place, person, recent and remote memory, attention span and concentration, language, and fund of knowledge is normal.  Speech is not dysarthric.  CRANIAL NERVES: Pupils equal round and reactive to light.  Normal conjugate, extra-ocular eye movements in all directions of gaze.  No ptosis. Face is symmetric. Palate elevates symmetrically.  Tongue is midline.  MOTOR:  Motor strength is 5/5 in all extremities.    SENSORY:  Reduced light touch over the right medial distribution.  COORDINATION/GAIT:  Gait narrow based and stable.   Data: Labs 10/20/2013: HbA1c 6.1, Cr 1.10, BUN 17, AST 29, ALT 42  MRI head wo contrast 10/17/2007: No acute or reversible process. Changes of small vessel disease in the hemispheric white matter bilaterally, advanced for a patient of this age and likely related to the patient's hypertension.  MRI lumbar spine wo contrast 05/04/2014: Mild lumbar degenerative change without neural impingement.   T12 bone lesion on the right is well circumscribed most likely benign such as hemangioma. Findings suggestive of chronic bladder outlet obstruction.  EMG 07/07/2014: 1. Right median neuropathy, at or distal to the wrist consistent with the clinical diagnosis of carpal tunnel syndrome. Overall, these findings are moderate to severe in degree electrically. 2. 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 Definitive diagnosis cannot be made as sensory responses are preserved in the lower extremity.   IMPRESSION/PLAN: 1. Right carpal tunnel syndrome  - EMG showing moderate to severe CTS on the right.  He has tried conservative therapy which has stabilized symptoms  - Continue using wrist splint and gabapentin 300mg  qhs  - He is interested in seeking opinion of orthopaedics for surgical opinion, so will send referral to Dr. Amedeo Plenty at [facility]  2.  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  Left meralgia paresthetica, stable  - Weight loss encouraged  4.  Return to clinic as needed   The duration of this appointment visit was 25 minutes of face-to-face time with the patient.  Greater than 50% of this time was spent in counseling, explanation of diagnosis, planning of further management, and coordination of care.   Thank you for allowing me to participate in patient's care.  If I can answer any additional questions, I would be  pleased to do so.    Sincerely,    Donika K. Posey Pronto, DO

## 2015-02-22 ENCOUNTER — Other Ambulatory Visit: Payer: Self-pay | Admitting: Family Medicine

## 2015-02-22 DIAGNOSIS — D169 Benign neoplasm of bone and articular cartilage, unspecified: Secondary | ICD-10-CM

## 2015-02-24 ENCOUNTER — Ambulatory Visit
Admission: RE | Admit: 2015-02-24 | Discharge: 2015-02-24 | Disposition: A | Discharge status: Home / Self Care | Payer: 59 | Source: Ambulatory Visit | Attending: Family Medicine | Admitting: Family Medicine

## 2015-02-24 DIAGNOSIS — D169 Benign neoplasm of bone and articular cartilage, unspecified: Secondary | ICD-10-CM

## 2015-05-04 ENCOUNTER — Other Ambulatory Visit: Payer: Self-pay | Admitting: Family Medicine

## 2015-05-04 DIAGNOSIS — R220 Localized swelling, mass and lump, head: Secondary | ICD-10-CM

## 2015-05-20 ENCOUNTER — Ambulatory Visit
Admission: RE | Admit: 2015-05-20 | Discharge: 2015-05-20 | Disposition: A | Discharge status: Home / Self Care | Payer: 59 | Source: Ambulatory Visit | Attending: Family Medicine | Admitting: Family Medicine

## 2015-05-20 DIAGNOSIS — R220 Localized swelling, mass and lump, head: Secondary | ICD-10-CM

## 2015-05-27 ENCOUNTER — Other Ambulatory Visit: Payer: 59

## 2015-07-17 DIAGNOSIS — K7689 Other specified diseases of liver: Secondary | ICD-10-CM

## 2015-07-17 HISTORY — DX: Other specified diseases of liver: K76.89

## 2016-01-10 ENCOUNTER — Other Ambulatory Visit: Payer: Self-pay | Admitting: Family Medicine

## 2016-01-10 DIAGNOSIS — R7989 Other specified abnormal findings of blood chemistry: Secondary | ICD-10-CM

## 2016-01-10 DIAGNOSIS — R945 Abnormal results of liver function studies: Principal | ICD-10-CM

## 2016-01-25 ENCOUNTER — Ambulatory Visit
Admission: RE | Admit: 2016-01-25 | Discharge: 2016-01-25 | Disposition: A | Discharge status: Home / Self Care | Payer: 59 | Source: Ambulatory Visit | Attending: Family Medicine | Admitting: Family Medicine

## 2016-01-25 DIAGNOSIS — R7989 Other specified abnormal findings of blood chemistry: Secondary | ICD-10-CM

## 2016-01-25 DIAGNOSIS — R945 Abnormal results of liver function studies: Principal | ICD-10-CM

## 2016-09-20 ENCOUNTER — Other Ambulatory Visit: Payer: Self-pay | Admitting: Family Medicine

## 2016-09-20 ENCOUNTER — Ambulatory Visit
Admission: RE | Admit: 2016-09-20 | Discharge: 2016-09-20 | Disposition: A | Discharge status: Home / Self Care | Payer: 59 | Source: Ambulatory Visit | Attending: Family Medicine | Admitting: Family Medicine

## 2016-09-20 DIAGNOSIS — M25511 Pain in right shoulder: Secondary | ICD-10-CM

## 2017-10-11 IMAGING — DX DG SHOULDER 2+V*R*
3 series · 3 of 3 positions shown · non-contrast
Comparison: None

CLINICAL DATA: Right shoulder pain for years.  No known injury.

EXAM:
RIGHT SHOULDER - 2+ VIEW

[dg shoulder right (1 of 3)]
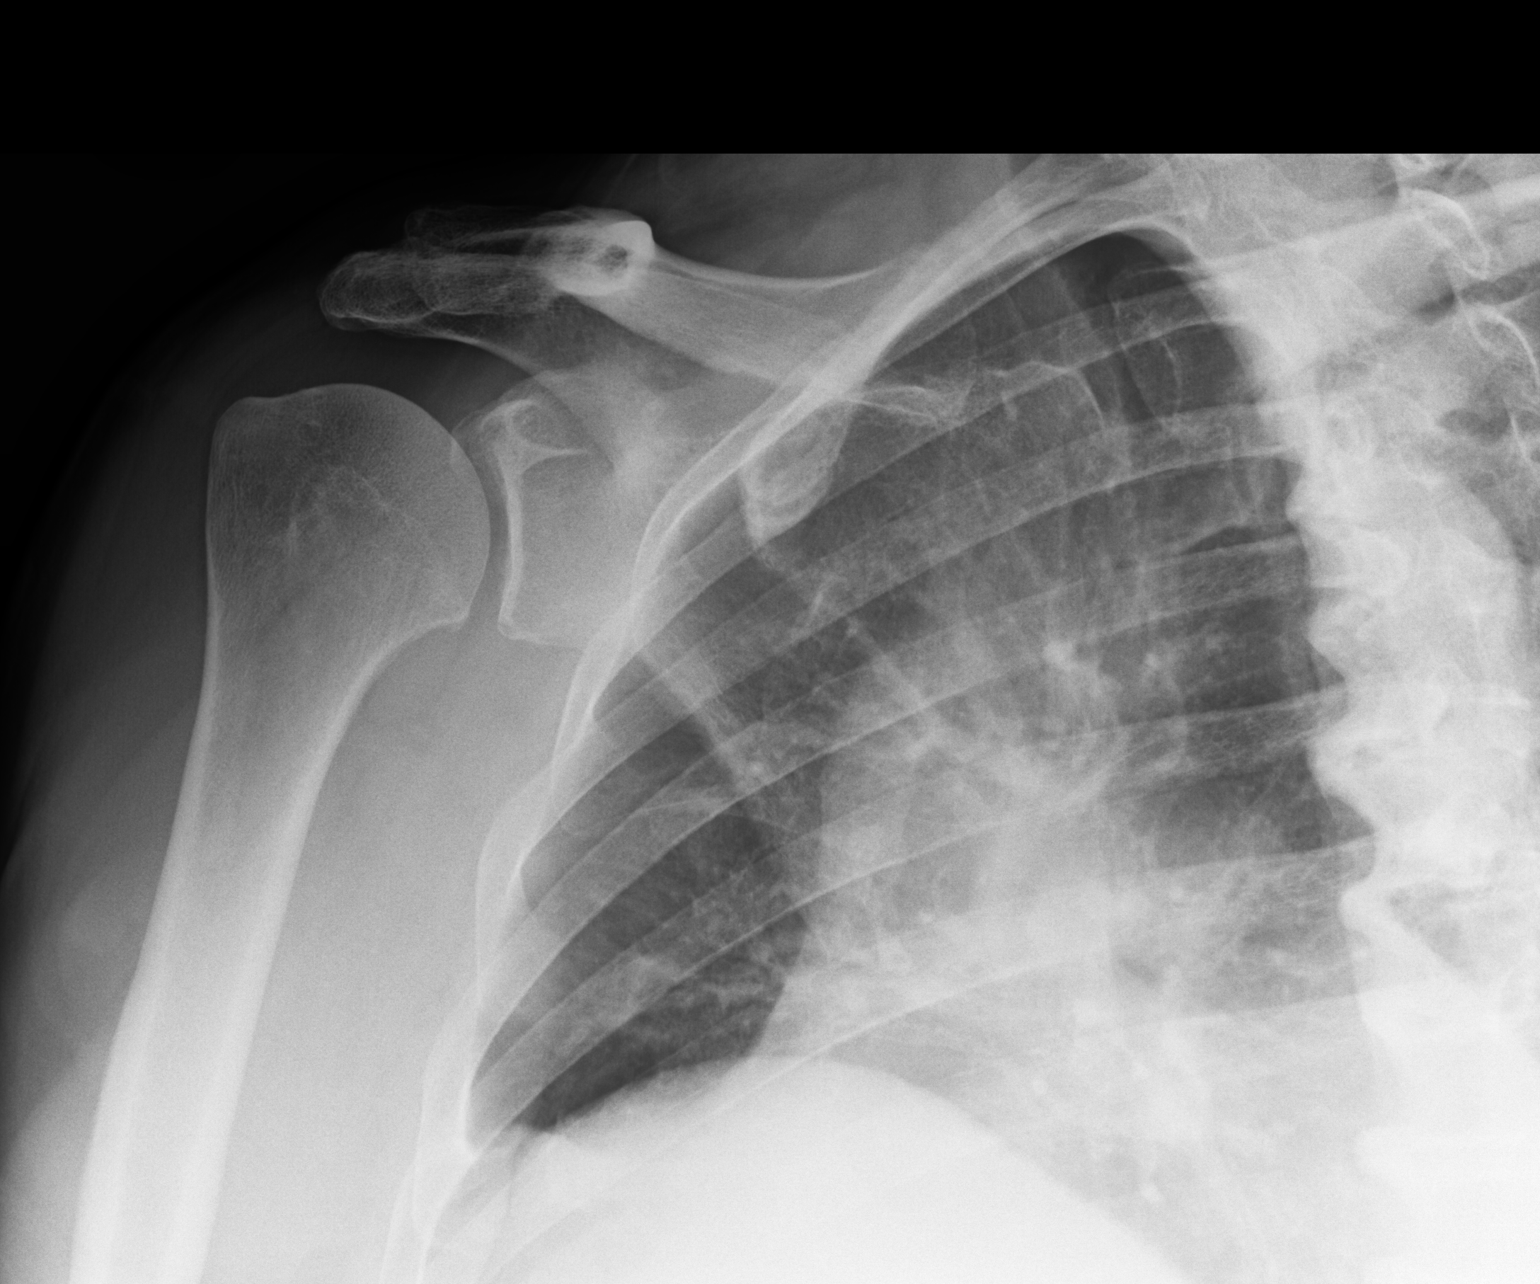

[dg shoulder right (2 of 3)]
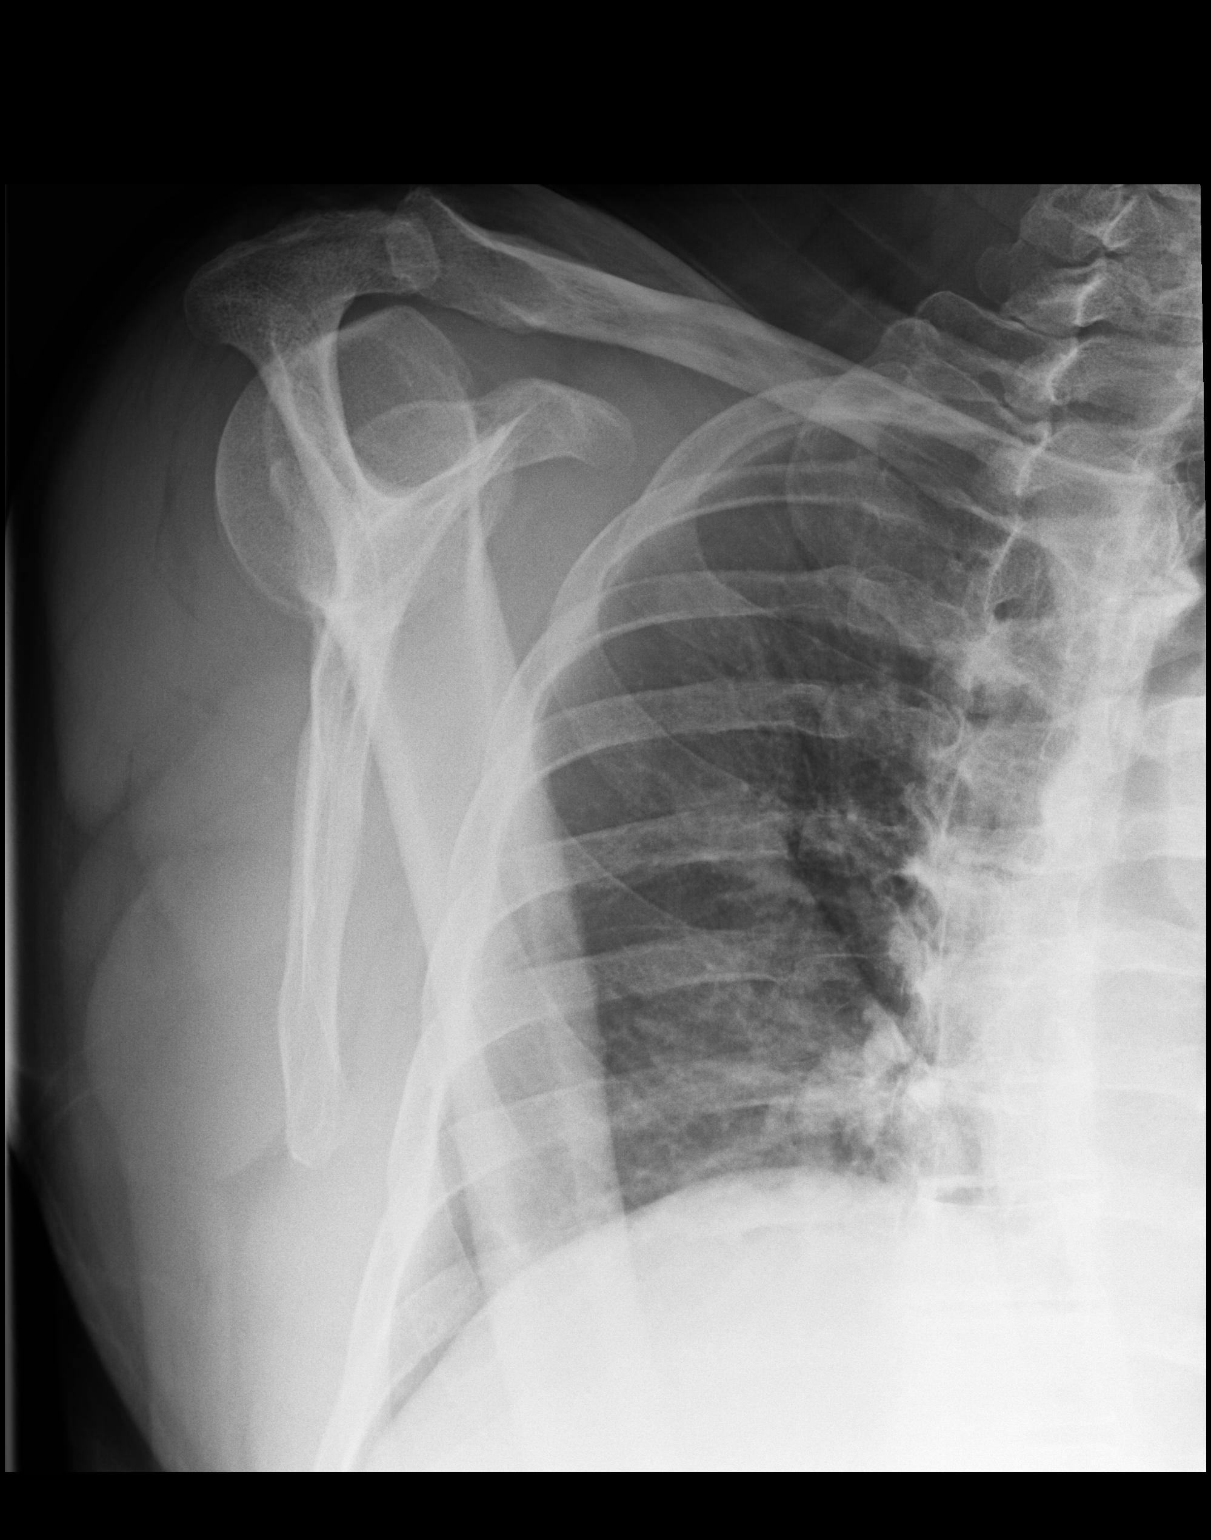

[dg shoulder right (3 of 3)]
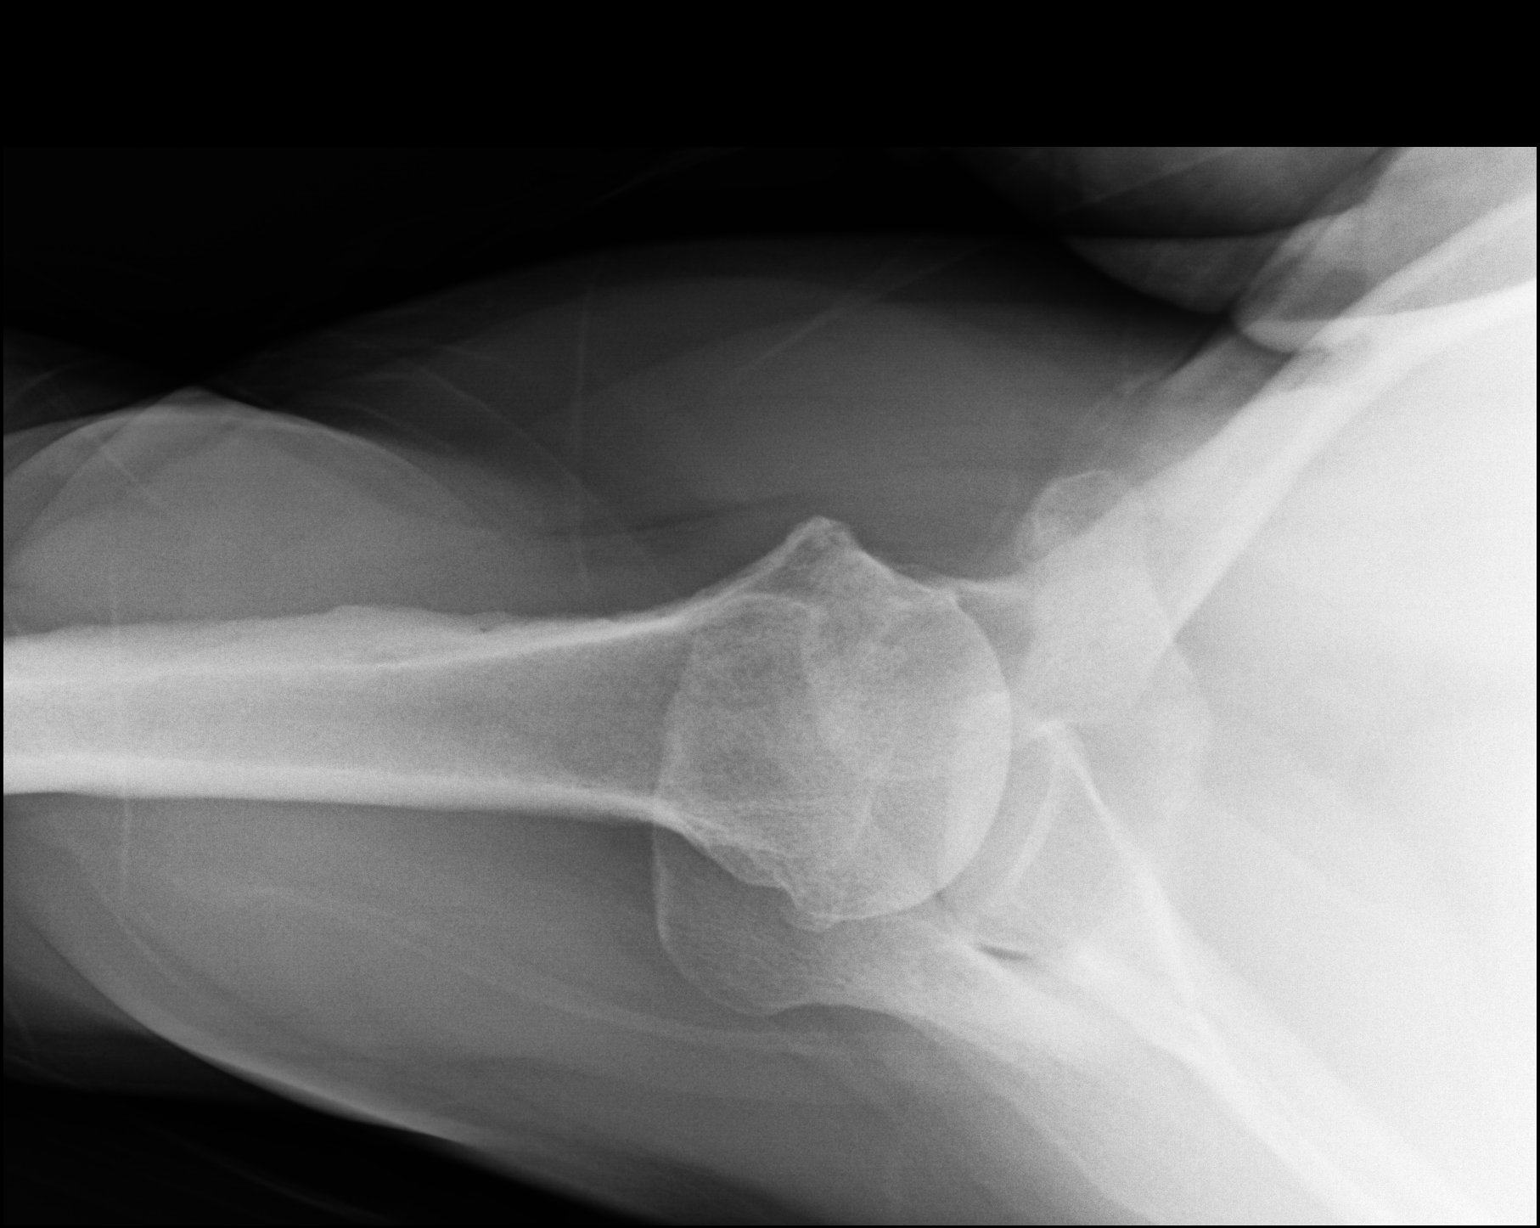

[3 of 3 positions shown; findings below may reference images not displayed]

FINDINGS: Mild osteoarthritic changes in the right AC joint with joint space
narrowing and spurring. Glenohumeral joint is maintained. No acute
bony abnormality. Specifically, no fracture, subluxation, or
dislocation. Soft tissues are intact.
IMPRESSION: Mild osteoarthritic changes in the right AC joint. No acute bony
abnormality.

## 2018-09-09 DIAGNOSIS — R319 Hematuria, unspecified: Secondary | ICD-10-CM | POA: Diagnosis not present

## 2018-09-16 DIAGNOSIS — N401 Enlarged prostate with lower urinary tract symptoms: Secondary | ICD-10-CM | POA: Diagnosis not present

## 2018-09-16 DIAGNOSIS — R350 Frequency of micturition: Secondary | ICD-10-CM | POA: Diagnosis not present

## 2018-09-16 DIAGNOSIS — N130 Hydronephrosis with ureteropelvic junction obstruction: Secondary | ICD-10-CM | POA: Diagnosis not present

## 2018-09-16 DIAGNOSIS — R310 Gross hematuria: Secondary | ICD-10-CM | POA: Diagnosis not present

## 2018-09-18 DIAGNOSIS — R310 Gross hematuria: Secondary | ICD-10-CM | POA: Diagnosis not present

## 2018-09-18 DIAGNOSIS — N132 Hydronephrosis with renal and ureteral calculous obstruction: Secondary | ICD-10-CM | POA: Diagnosis not present

## 2018-09-23 DIAGNOSIS — N210 Calculus in bladder: Secondary | ICD-10-CM | POA: Diagnosis not present

## 2018-09-23 DIAGNOSIS — R310 Gross hematuria: Secondary | ICD-10-CM | POA: Diagnosis not present

## 2018-09-23 DIAGNOSIS — N130 Hydronephrosis with ureteropelvic junction obstruction: Secondary | ICD-10-CM | POA: Diagnosis not present

## 2018-09-26 ENCOUNTER — Other Ambulatory Visit: Payer: Self-pay | Admitting: Urology

## 2018-09-29 ENCOUNTER — Other Ambulatory Visit: Payer: Self-pay

## 2018-09-29 ENCOUNTER — Encounter (HOSPITAL_BASED_OUTPATIENT_CLINIC_OR_DEPARTMENT_OTHER): Payer: Self-pay

## 2018-09-29 NOTE — Progress Notes (Signed)
Spoke with:  Arnell Sieving NPO:  After Midnight, no gum, candy, or mints  Arrival time:  0730AM Labs: BMP, EKG AM medications:  None Pre op orders:  Yes Ride home:  Anda Kraft (wife) [phone]

## 2018-09-30 NOTE — Progress Notes (Signed)
Unable to leave message on pt's mobile voicemail.  I was calling to ask pt if he is experiencing resp symptoms or has been around anyone who is.  Also, to ask if he's traveled and to being only one person to the surgery center on Friday.

## 2018-10-02 NOTE — H&P (Signed)
HPI: Hunter Stone is a 68 year-old male with left hydronephrosis and a large bladder stone.  He first noticed the symptoms 09/05/2018. He did see the blood in his urine. He has seen blood clots.   He does have a burning sensation when he urinates. He is not currently having trouble urinating.   He has not had kidney stones. He is not having pain. He has not recently had unwanted weight loss.   09/16/18: On 09/09/18 he was seen for a tingling sensation when he urinates and indicated that he had experienced gross hematuria 4 days prior. He said he had similar symptoms about 3 years ago. He is taking Flomax due to BPH with a decreased force of stream and frequency. The Flomax resulted in improvement in his nocturia but he has now started having some increase in his nocturia as well as some daytime frequency. 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 On 2 occasions he said he felt a substance that he described as "sand like" at the tip of his penis. He has taken a course of doxycycline. He has no history of stones. There is no family history of calculus disease. He may have noted some increased urgency and some increased frequency but otherwise no real changes in his voiding pattern and remains on tamsulosin. He has never smoked but worked around [employer], [employer]. There is no family history of GU malignancy.   09/23/18: He returns today for completion of his workup of gross hematuria having undergone upper tract evaluation with a CT scan. No new complaints are noted today.     ALLERGIES: No Known Drug Allergies    MEDICATIONS: Doxycycline Hyclate 100 mg tablet  Tamsulosin Hcl 0.4 mg capsule  Viagra 50 mg tablet  Zyrtec 10 mg tablet  Aspirin Ec 81 mg tablet, delayed release  Benicar Hct 20 mg-12.5 mg tablet  Cinnamon 500 mg capsule  Coq-10 30 mg capsule  Rosuvastatin Calcium 10 mg tablet  Vitamin D3 1,000  unit capsule     GU PSH: Locm 300-399Mg /Ml Iodine,1Ml - 09/18/2018    NON-GU PSH: Anesth, Achilles Tendon Surg - about 2015 Remove Lesion Neck/chest - about 2018 Repair Biceps Tendon, Right - about 09/13/2017    GU PMH: BPH w/LUTS, He does have BPH with some voiding symptoms. He is on tamsulosin. - 09/16/2018 Gross hematuria, He has experienced gross hematuria and has some slight voiding symptoms. In addition he has been exposed to industrial solvents in the past. I therefore have discussed the need for a full evaluation of the source of his hematuria with a CT scan as well as lower tract evaluation with cystoscopy. - 09/16/2018 Hydronephrosis, Left, He did appear to have left hydronephrosis on a previous CT scan 5 years ago. That will be reassessed at the time of his CT scan. - 09/16/2018 Urinary Frequency, He reports he seen some slight increased frequency, increased nocturia and some increased urgency. - 09/16/2018 Bladder Stone    NON-GU PMH: Achilles tendinitis, right leg Allergic rhinitis, unspecified Carpal tunnel syndrome, unspecified upper limb Hypertension Mixed hyperlipidemia Obstructive sleep apnea (adult) (pediatric) Prediabetes Sciatica, right side Separation of muscle (nontraumatic), other site    FAMILY HISTORY: Heart Attack - Grandfather Huntington Disease - Father Lung Cancer - Mother   SOCIAL HISTORY: Marital Status: Married Preferred Language: English Current Smoking Status: Patient has never smoked.   Tobacco Use Assessment Completed: Used Tobacco  in last 30 days? Does not use smokeless tobacco. Drinks 2 drinks per day.  Does not use drugs. Drinks 2 caffeinated drinks per day.    REVIEW OF SYSTEMS:    GU Review Male:   Patient denies frequent urination, hard to postpone urination, burning/ pain with urination, get up at night to urinate, leakage of urine, stream starts and stops, trouble starting your stream, have to strain to urinate , erection problems, and  penile pain.  Gastrointestinal (Upper):   Patient denies indigestion/ heartburn, vomiting, and nausea.  Gastrointestinal (Lower):   Patient denies diarrhea and constipation.  Constitutional:   Patient denies fever, night sweats, weight loss, and fatigue.  Skin:   Patient denies skin rash/ lesion and itching.  Eyes:   Patient denies blurred vision and double vision.  Ears/ Nose/ Throat:   Patient denies sore throat and sinus problems.  Hematologic/Lymphatic:   Patient denies swollen glands and easy bruising.  Cardiovascular:   Patient denies leg swelling and chest pains.  Respiratory:   Patient denies cough and shortness of breath.  Endocrine:   Patient denies excessive thirst.  Musculoskeletal:   Patient denies back pain and joint pain.  Neurological:   Patient denies headaches and dizziness.  Psychologic:   Patient denies depression and anxiety.   VITAL SIGNS:    Weight 190 lb / 86.18 kg  Height 66 in / 167.64 cm  BP 159/77 mmHg  Pulse 60 /min  BMI 30.7 kg/m   GU PHYSICAL EXAMINATION:    Scrotum: No lesions. No edema. No cysts. No warts.  Epididymides: Right: no spermatocele, no masses, no cysts, no tenderness, no induration, no enlargement. Left: no spermatocele, no masses, no cysts, no tenderness, no induration, no enlargement.  Testes: No tenderness, no swelling, no enlargement left testes. No tenderness, no swelling, no enlargement right testes. Normal location left testes. Normal location right testes. No mass, no cyst, no varicocele, no hydrocele left testes. No mass, no cyst, no varicocele, no hydrocele right testes.  Urethral Meatus: Normal size. No lesion, no wart, no discharge, no polyp. Normal location.  Penis: Circumcised, no warts, no cracks. No dorsal Peyronie's plaques, no left corporal Peyronie's plaques, no right corporal Peyronie's plaques, no scarring, no warts. No balanitis, no meatal stenosis.   MULTI-SYSTEM PHYSICAL EXAMINATION:    Constitutional: Well-nourished.  No physical deformities. Normally developed. Good grooming.  Neck: Neck symmetrical, not swollen. Normal tracheal position.  Respiratory: No labored breathing, no use of accessory muscles.   Cardiovascular: Normal temperature, normal extremity pulses, no swelling, no varicosities.  Lymphatic: No enlargement of neck, axillae, groin.  Skin: No paleness, no jaundice, no cyanosis. No lesion, no ulcer, no rash.  Neurologic / Psychiatric: Oriented to time, oriented to place, oriented to person. No depression, no anxiety, no agitation.  Gastrointestinal: No mass, no tenderness, no rigidity, non obese abdomen.  Eyes: Normal conjunctivae. Normal eyelids.  Ears, Nose, Mouth, and Throat: Left ear no scars, no lesions, no masses. Right ear no scars, no lesions, no masses. Nose no scars, no lesions, no masses. Normal hearing. Normal lips.  Musculoskeletal: Normal gait and station of head and neck.    MULTI-SYSTEM PHYSICAL EXAMINATION:       PAST DATA REVIEWED:  Source Of History:  Patient, Outside Source  Lab Test Review:   PSA, BUN/Creatinine  Records Review:   Previous Patient Records  X-Ray Review: C.T. Hematuria: Reviewed Films. Reviewed Report. Discussed With Patient. EXAM: CT ABDOMEN AND PELVIS WITHOUT AND WITH CONTRAST TECHNIQUE: Multidetector  CT imaging of the abdomen and pelvis was performed following the standard protocol before and following the bolus administration of intravenous contrast. CONTRAST: 125 cc Omnipaque 300 COMPARISON: Lumbar spine MRI 05/04/2014 FINDINGS: Lower chest: Normal heart size. Dependent atelectasis within the bilateral lower lobes. 4 mm right middle lobe nodule (image 5; series 7). No pleural effusion. Hepatobiliary: Scattered subcentimeter too small to characterize low-attenuation lesions within the liver. Additionally there is a 2.3 cm cyst within the left hepatic lobe (image 24; series 5) and a 2.0 cm cyst within the hepatic dome (image 15; series 5). Gallbladder is  unremarkable. No intrahepatic or extrahepatic biliary ductal dilatation. Pancreas: Unremarkable Spleen: Unremarkable Adrenals/Urinary Tract: Adrenal glands are normal. Noncontrast images demonstrate symmetric renal size. Kidneys enhance symmetrically with contrast. No suspicious enhancing renal lesions identified. There is marked left hydroureteronephrosis to the level of the urinary bladder (image 87; series 9). There are multiple stones within the urinary bladder measuring up to 2.5 cm. Delayed images demonstrate excretion of contrast material into the bilateral renal collecting systems, ureters and bladder. There is non opacification of the majority of the distal aspect of the left ureter (image 85; series 9) which contains a contrast fluid level. The distal aspect of the left ureter near the UVJ is narrowed (image 87; series 9). Small diverticulum off the left aspect of the urinary bladder. Stomach/Bowel: Normal morphology of the stomach. No evidence for small bowel obstruction. No free fluid or free intraperitoneal air. Vascular/Lymphatic: Normal caliber abdominal aorta. Peripheral calcified atherosclerotic plaque. No retroperitoneal lymphadenopathy. Reproductive: Heterogeneous prostate. Other: Small bilateral fat containing inguinal hernias. Musculoskeletal: Lumbar and lower thoracic spine degenerative changes. No aggressive or acute appearing osseous lesions. IMPRESSION: 1. There is marked left hydroureteronephrosis to the level of the urinary bladder. There is a contrast fluid level demonstrated within the distal aspect of the left ureter on delayed phase imaging. Suggestion of focal narrowing at the distal aspect of the left ureter near the urinary bladder. No definite obstructing mass identified. Possibility of distal stricture not excluded. 2. Multiple urinary bladder stones. 3. 4 mm right middle lobe nodule. No follow-up needed if patient is low-risk. Non-contrast chest CT can be considered in 12 months  if patient is high-risk.     07/13/05  PSA  Total PSA 0.49     09/16/18  General Chemistry  Creatinine 1.0 mg/dL   PROCEDURES:         Flexible Cystoscopy - done on 09/16/18 Risks, benefits, and some of the potential complications were discussed. Sterile technique and 2% Lidocaine intraurethral analgesia were used.  Meatus:  Normal size. Normal location. Normal condition.  Urethra:  No strictures.  External Sphincter:  Normal.  Verumontanum:  Normal.  Prostate:  Obstructing. Moderate hyperplasia.  Bladder Neck:  Non-obstructing.  Ureteral Orifices:  Normal location. Normal size. Normal shape. Effluxed clear urine.  Bladder:  Severe trabeculation. Cellules. Many large stones. No tumors. Normal mucosa.      The lower urinary tract was carefully examined. The procedure was well-tolerated and without complications. Instructions were given to call the office immediately for bloody urine, difficulty urinating, urinary retention, painful or frequent urination, fever or other illness. The patient stated that he understood these instructions and would comply with them.         Urinalysis w/Scope - 81001 Dipstick Dipstick Cont'd Micro  Color: Yellow Bilirubin: Neg WBC/hpf: 0 - 5/hpf  Appearance: Clear Ketones: Neg RBC/hpf: 0 - 2/hpf  Specific Gravity: 1.025 Blood: Trace Bacteria: NS (Not Seen)  pH: 6.0 Protein: Neg Cystals: NS (Not Seen)  Glucose: Neg Urobilinogen: 0.2 Casts: NS (Not Seen)    Nitrites: Neg Trichomonas: Not Present    Leukocyte Esterase: Neg Mucous: Not Present      Epithelial Cells: 0 - 5/hpf      Yeast: NS (Not Seen)      Sperm: Not Present      ASSESSMENT/PLAN:      ICD-10 Details  1 GU:   Gross hematuria - R31.0 Stable - His gross hematuria appears to be secondary to his bladder calculi. Found no other worrisome lesions within the bladder.  2   Hydronephrosis - N13.0 Left, He appears to have chronic left hydronephrosis that has been present for at least 5 years  based on his MRI images done in 10/15 which revealed significant hydronephrosis at that time. He has a normal creatinine and appears to have maintained adequate renal parenchyma indicating that this is likely nonobstructive but this needs to be determined definitively.  3   Bladder Stone - N21.0 He has bladder calculi as the cause of his gross hematuria. We therefore discussed proceeding with cystolitholapaxy. I went over the procedure with him in detail including its risks and complications, the probability of success, the outpatient nature of the procedure as well as the anticipated postoperative course. He understands and has elected to proceed. At the time I will also perform a left retrograde pyelogram and possible left ureteroscopy.

## 2018-10-03 ENCOUNTER — Ambulatory Visit (HOSPITAL_BASED_OUTPATIENT_CLINIC_OR_DEPARTMENT_OTHER): Payer: Medicare Other | Admitting: Anesthesiology

## 2018-10-03 ENCOUNTER — Other Ambulatory Visit: Payer: Self-pay

## 2018-10-03 ENCOUNTER — Encounter (HOSPITAL_BASED_OUTPATIENT_CLINIC_OR_DEPARTMENT_OTHER)
Admission: RE | Disposition: A | Discharge status: Home / Self Care | Payer: Self-pay | Source: Home / Self Care | Attending: Urology

## 2018-10-03 ENCOUNTER — Ambulatory Visit (HOSPITAL_BASED_OUTPATIENT_CLINIC_OR_DEPARTMENT_OTHER)
Admission: RE | Admit: 2018-10-03 | Discharge: 2018-10-03 | Disposition: A | Discharge status: Home / Self Care | Payer: Medicare Other | Attending: Urology | Admitting: Urology

## 2018-10-03 ENCOUNTER — Encounter (HOSPITAL_BASED_OUTPATIENT_CLINIC_OR_DEPARTMENT_OTHER): Payer: Self-pay | Admitting: Anesthesiology

## 2018-10-03 DIAGNOSIS — Z79899 Other long term (current) drug therapy: Secondary | ICD-10-CM | POA: Insufficient documentation

## 2018-10-03 DIAGNOSIS — R350 Frequency of micturition: Secondary | ICD-10-CM | POA: Insufficient documentation

## 2018-10-03 DIAGNOSIS — N210 Calculus in bladder: Secondary | ICD-10-CM | POA: Diagnosis not present

## 2018-10-03 DIAGNOSIS — R3912 Poor urinary stream: Secondary | ICD-10-CM | POA: Insufficient documentation

## 2018-10-03 DIAGNOSIS — G4733 Obstructive sleep apnea (adult) (pediatric): Secondary | ICD-10-CM | POA: Diagnosis not present

## 2018-10-03 DIAGNOSIS — I10 Essential (primary) hypertension: Secondary | ICD-10-CM | POA: Diagnosis not present

## 2018-10-03 DIAGNOSIS — N1330 Unspecified hydronephrosis: Secondary | ICD-10-CM | POA: Insufficient documentation

## 2018-10-03 DIAGNOSIS — N401 Enlarged prostate with lower urinary tract symptoms: Secondary | ICD-10-CM | POA: Diagnosis not present

## 2018-10-03 DIAGNOSIS — J309 Allergic rhinitis, unspecified: Secondary | ICD-10-CM | POA: Insufficient documentation

## 2018-10-03 DIAGNOSIS — Z7982 Long term (current) use of aspirin: Secondary | ICD-10-CM | POA: Insufficient documentation

## 2018-10-03 DIAGNOSIS — N323 Diverticulum of bladder: Secondary | ICD-10-CM | POA: Insufficient documentation

## 2018-10-03 DIAGNOSIS — E782 Mixed hyperlipidemia: Secondary | ICD-10-CM | POA: Diagnosis not present

## 2018-10-03 HISTORY — DX: Other specified diseases of liver: K76.89

## 2018-10-03 HISTORY — PX: HOLMIUM LASER APPLICATION: SHX5852

## 2018-10-03 HISTORY — DX: Presence of spectacles and contact lenses: Z97.3

## 2018-10-03 HISTORY — DX: Localized swelling, mass and lump, neck: R22.1

## 2018-10-03 HISTORY — DX: Prediabetes: R73.03

## 2018-10-03 HISTORY — DX: Calculus in bladder: N21.0

## 2018-10-03 HISTORY — DX: Benign prostatic hyperplasia without lower urinary tract symptoms: N40.0

## 2018-10-03 HISTORY — DX: Unspecified hearing loss, unspecified ear: H91.90

## 2018-10-03 HISTORY — DX: Personal history of diseases of the blood and blood-forming organs and certain disorders involving the immune mechanism: Z86.2

## 2018-10-03 HISTORY — PX: CYSTOSCOPY WITH LITHOLAPAXY: SHX1425

## 2018-10-03 LAB — BASIC METABOLIC PANEL
Anion gap: 7 (ref 5–15)
BUN: 25 mg/dL — ABNORMAL HIGH (ref 8–23)
CALCIUM: 9.9 mg/dL (ref 8.9–10.3)
CO2: 25 mmol/L (ref 22–32)
Chloride: 106 mmol/L (ref 98–111)
Creatinine, Ser: 1.25 mg/dL — ABNORMAL HIGH (ref 0.61–1.24)
GFR calc Af Amer: >60 mL/min (ref >60)
GFR calc non Af Amer: 59 mL/min — ABNORMAL LOW (ref >60)
Glucose, Bld: 120 mg/dL — ABNORMAL HIGH (ref 70–99)
Potassium: 3.7 mmol/L (ref 3.5–5.1)
SODIUM: 138 mmol/L (ref 135–145)

## 2018-10-03 SURGERY — CYSTOSCOPY, WITH BLADDER CALCULUS LITHOLAPAXY
Anesthesia: General

## 2018-10-03 MED ORDER — PHENAZOPYRIDINE HCL 200 MG PO TABS: 200.0000 mg | ORAL_TABLET | Freq: Three times a day (TID) | ORAL | 0 refills | Status: DC | PRN

## 2018-10-03 MED ORDER — CIPROFLOXACIN IN D5W 400 MG/200ML IV SOLN
400.0000 mg | Freq: Once | INTRAVENOUS | Status: AC
  Administered 2018-10-03: 400 mg via INTRAVENOUS
  Filled 2018-10-03: qty 200

## 2018-10-03 MED ORDER — KETOROLAC TROMETHAMINE 30 MG/ML IJ SOLN
INTRAMUSCULAR | Status: AC
  Filled 2018-10-03: qty 1

## 2018-10-03 MED ORDER — FENTANYL CITRATE (PF) 100 MCG/2ML IJ SOLN
INTRAMUSCULAR | Status: DC | PRN
  Administered 2018-10-03: 50 ug via INTRAVENOUS
  Administered 2018-10-03 (×2): 25 ug via INTRAVENOUS

## 2018-10-03 MED ORDER — KETOROLAC TROMETHAMINE 30 MG/ML IJ SOLN
INTRAMUSCULAR | Status: DC | PRN
  Administered 2018-10-03: 30 mg via INTRAVENOUS

## 2018-10-03 MED ORDER — MIDAZOLAM HCL 5 MG/5ML IJ SOLN
INTRAMUSCULAR | Status: DC | PRN
  Administered 2018-10-03: 2 mg via INTRAVENOUS

## 2018-10-03 MED ORDER — PROPOFOL 10 MG/ML IV BOLUS
INTRAVENOUS | Status: AC
  Filled 2018-10-03: qty 40

## 2018-10-03 MED ORDER — EPHEDRINE 5 MG/ML INJ
INTRAVENOUS | Status: AC
  Filled 2018-10-03: qty 10

## 2018-10-03 MED ORDER — SODIUM CHLORIDE 0.9 % IR SOLN
Status: DC | PRN
  Administered 2018-10-03: 3000 mL via INTRAVESICAL

## 2018-10-03 MED ORDER — ONDANSETRON HCL 4 MG/2ML IJ SOLN
INTRAMUSCULAR | Status: DC | PRN
  Administered 2018-10-03: 4 mg via INTRAVENOUS

## 2018-10-03 MED ORDER — DEXAMETHASONE SODIUM PHOSPHATE 10 MG/ML IJ SOLN
INTRAMUSCULAR | Status: AC
  Filled 2018-10-03: qty 1

## 2018-10-03 MED ORDER — PHENAZOPYRIDINE HCL 200 MG PO TABS
200.0000 mg | ORAL_TABLET | Freq: Three times a day (TID) | ORAL | Status: DC
  Filled 2018-10-03: qty 1

## 2018-10-03 MED ORDER — CIPROFLOXACIN IN D5W 400 MG/200ML IV SOLN
INTRAVENOUS | Status: AC
  Filled 2018-10-03: qty 200

## 2018-10-03 MED ORDER — ONDANSETRON HCL 4 MG/2ML IJ SOLN
INTRAMUSCULAR | Status: AC
  Filled 2018-10-03: qty 2

## 2018-10-03 MED ORDER — LIDOCAINE HCL (CARDIAC) PF 100 MG/5ML IV SOSY
PREFILLED_SYRINGE | INTRAVENOUS | Status: DC | PRN
  Administered 2018-10-03: 100 mg via INTRAVENOUS

## 2018-10-03 MED ORDER — OXYCODONE HCL 5 MG PO TABS
5.0000 mg | ORAL_TABLET | Freq: Once | ORAL | Status: DC | PRN
  Filled 2018-10-03: qty 1

## 2018-10-03 MED ORDER — TAMSULOSIN HCL 0.4 MG PO CAPS
0.4000 mg | ORAL_CAPSULE | Freq: Once | ORAL | Status: AC
  Administered 2018-10-03: 0.4 mg via ORAL
  Filled 2018-10-03: qty 1

## 2018-10-03 MED ORDER — TAMSULOSIN HCL 0.4 MG PO CAPS
ORAL_CAPSULE | ORAL | Status: AC
  Filled 2018-10-03: qty 1

## 2018-10-03 MED ORDER — MIDAZOLAM HCL 2 MG/2ML IJ SOLN
INTRAMUSCULAR | Status: AC
  Filled 2018-10-03: qty 2

## 2018-10-03 MED ORDER — PROPOFOL 10 MG/ML IV BOLUS
INTRAVENOUS | Status: DC | PRN
  Administered 2018-10-03: 50 mg via INTRAVENOUS
  Administered 2018-10-03: 150 mg via INTRAVENOUS

## 2018-10-03 MED ORDER — DEXAMETHASONE SODIUM PHOSPHATE 10 MG/ML IJ SOLN
INTRAMUSCULAR | Status: DC | PRN
  Administered 2018-10-03: 10 mg via INTRAVENOUS

## 2018-10-03 MED ORDER — ONDANSETRON HCL 4 MG/2ML IJ SOLN
4.0000 mg | Freq: Once | INTRAMUSCULAR | Status: DC | PRN
  Filled 2018-10-03: qty 2

## 2018-10-03 MED ORDER — LIDOCAINE 2% (20 MG/ML) 5 ML SYRINGE
INTRAMUSCULAR | Status: AC
  Filled 2018-10-03: qty 5

## 2018-10-03 MED ORDER — FENTANYL CITRATE (PF) 100 MCG/2ML IJ SOLN
25.0000 ug | INTRAMUSCULAR | Status: DC | PRN
  Filled 2018-10-03: qty 1

## 2018-10-03 MED ORDER — LACTATED RINGERS IV SOLN
INTRAVENOUS | Status: DC
  Administered 2018-10-03 (×4): via INTRAVENOUS
  Filled 2018-10-03: qty 1000

## 2018-10-03 MED ORDER — OXYCODONE HCL 5 MG/5ML PO SOLN
5.0000 mg | Freq: Once | ORAL | Status: DC | PRN
  Filled 2018-10-03: qty 5

## 2018-10-03 MED ORDER — IOPAMIDOL (ISOVUE-370) INJECTION 76%
INTRAVENOUS | Status: DC | PRN
  Administered 2018-10-03: 20 mL

## 2018-10-03 MED ORDER — FENTANYL CITRATE (PF) 100 MCG/2ML IJ SOLN
INTRAMUSCULAR | Status: AC
  Filled 2018-10-03: qty 2

## 2018-10-03 MED ORDER — EPHEDRINE SULFATE 50 MG/ML IJ SOLN
INTRAMUSCULAR | Status: DC | PRN
  Administered 2018-10-03 (×2): 10 mg via INTRAVENOUS

## 2018-10-03 SURGICAL SUPPLY — 20 items
BAG DRAIN URO-CYSTO SKYTR STRL (DRAIN) ×3 IMPLANT
CATH INTERMIT  6FR 70CM (CATHETERS) ×3 IMPLANT
CATH URET 5FR 28IN CONE TIP (BALLOONS)
CATH URET 5FR 70CM CONE TIP (BALLOONS) IMPLANT
CLOTH BEACON ORANGE TIMEOUT ST (SAFETY) ×6 IMPLANT
EVACUATOR MICROVAS BLADDER (UROLOGICAL SUPPLIES) ×3 IMPLANT
FIBER LASER FLEXIVA 1000 (UROLOGICAL SUPPLIES) ×3 IMPLANT
GLOVE BIO SURGEON STRL SZ8 (GLOVE) ×3 IMPLANT
GOWN STRL REUS W/ TWL XL LVL3 (GOWN DISPOSABLE) ×2 IMPLANT
GOWN STRL REUS W/TWL XL LVL3 (GOWN DISPOSABLE) ×1
GOWN XL W/COTTON TOWEL STD (GOWNS) ×3 IMPLANT
GUIDEWIRE ANG ZIPWIRE 038X150 (WIRE) IMPLANT
GUIDEWIRE STR DUAL SENSOR (WIRE) IMPLANT
IV NS IRRIG 3000ML ARTHROMATIC (IV SOLUTION) ×9 IMPLANT
KIT TURNOVER CYSTO (KITS) ×3 IMPLANT
MANIFOLD NEPTUNE II (INSTRUMENTS) IMPLANT
NS IRRIG 500ML POUR BTL (IV SOLUTION) IMPLANT
PACK CYSTO (CUSTOM PROCEDURE TRAY) ×3 IMPLANT
TUBE CONNECTING 12X1/4 (SUCTIONS) IMPLANT
WATER STERILE IRR 3000ML UROMA (IV SOLUTION) IMPLANT

## 2018-10-03 NOTE — Anesthesia Procedure Notes (Signed)
Performed by: Sabryna Lahm C, CRNA       

## 2018-10-03 NOTE — Transfer of Care (Signed)
Immediate Anesthesia Transfer of Care Note  Patient: Hunter Stone  Procedure(s) Performed: Procedure(s) (LRB): CYSTOSCOPY WITH LITHOLAPAXY/LEFT RETROGRADE (N/A) HOLMIUM LASER APPLICATION LITHIOPAXY  Patient Location: PACU  Anesthesia Type: General  Level of Consciousness: awake, sedated, patient cooperative and responds to stimulation  Airway & Oxygen Therapy: Patient Spontanous Breathing and Patient connected to [place] oxygen w/ cotton mask over [place] for potential coughing.  Post-op Assessment: Report given to PACU RN, Post -op Vital signs reviewed and stable and Patient moving all extremities  Post vital signs: Reviewed and stable  Complications: No apparent anesthesia complications

## 2018-10-03 NOTE — Anesthesia Preprocedure Evaluation (Signed)
Anesthesia Evaluation  Patient identified by MRN, date of birth, ID band Patient awake    Reviewed: Allergy & Precautions, NPO status , Patient's Chart, lab work & pertinent test results  History of Anesthesia Complications Negative for: history of anesthetic complications  Airway Mallampati: III  TM Distance: >3 FB Neck ROM: Full    Dental no notable dental hx. (+) Teeth Intact   Pulmonary sleep apnea ,    Pulmonary exam normal        Cardiovascular hypertension, Normal cardiovascular exam     Neuro/Psych negative neurological ROS  negative psych ROS   GI/Hepatic Neg liver ROS, PUD,   Endo/Other  negative endocrine ROS  Renal/GU negative Renal ROS  negative genitourinary   Musculoskeletal negative musculoskeletal ROS (+)   Abdominal   Peds  Hematology negative hematology ROS (+)   Anesthesia Other Findings   Reproductive/Obstetrics                            Anesthesia Physical Anesthesia Plan  ASA: II  Anesthesia Plan: General   Post-op Pain Management:    Induction: Intravenous  PONV Risk Score and Plan: 2 and Ondansetron, Dexamethasone and Treatment may vary due to age or medical condition  Airway Management Planned: LMA  Additional Equipment: None  Intra-op Plan:   Post-operative Plan: Extubation in OR  Informed Consent: I have reviewed the patients History and Physical, chart, labs and discussed the procedure including the risks, benefits and alternatives for the proposed anesthesia with the patient or authorized representative who has indicated his/her understanding and acceptance.     Dental advisory given  Plan Discussed with: CRNA and Anesthesiologist  Anesthesia Plan Comments:         Anesthesia Quick Evaluation

## 2018-10-03 NOTE — Op Note (Signed)
PATIENT:  Hunter Stone  PRE-OPERATIVE DIAGNOSIS: 1.  Left hydronephrosis 2.  Large bladder calculi.  POST-OPERATIVE DIAGNOSIS: 1.  Nonobstructive left hydronephrosis. 2.  Large bladder calculi. 3.  Left wall bladder diverticulum.  PROCEDURE: 1.  Cystoscopy with left retrograde pyelogram including interpretation. 2.  Cystolitholapaxy (5 cm) 3.  Fluoroscopy time less than 1 hour.  SURGEON:  Claybon Jabs  INDICATION: Hunter Stone is a 68 year old male who was having irritative voiding symptoms and was found to have microscopic hematuria.  Work-up revealed normal upper tract by CT scan with large bladder calculi noted within the bladder and left hydronephrosis that appears to have been present for at least 5 years without loss of parenchyma or intrarenal blunting of the calyces.  He is brought to the operating room for cystolitholapaxy and to evaluate his distal left ureter.  ANESTHESIA:  General  EBL:  Minimal  DRAINS: None  LOCAL MEDICATIONS USED:  None  SPECIMEN: Stone fragments given to patient  Description of procedure: After informed consent the patient was taken to the operating room and placed on the table in a supine position. General anesthesia was then administered. Once fully anesthetized the patient was moved to the dorsal lithotomy position and the genitalia were sterilely prepped and draped in standard fashion. An official timeout was then performed.  The 36 French cystoscope with 30 degree lens was passed down the urethra under direct vision.  The urethra was noted to be normal.  The prostatic urethra revealed some elongation with bilobar hypertrophy and a high bladder neck.  Upon entering the bladder I noted 3+ trabeculation.  The right and left ureteral orifice were noted to be of normal configuration and position.  Adjacent to the left orifice just lateral to it was a large, [place] bladder diverticulum with a lot of "sand" present.  2 very large stones were  identified as well as several smaller stones on the floor of the bladder.  No bladder tumors or other worrisome findings were identified.  A 6 French open-ended ureteral catheter was passed through the cystoscope and into the left ureteral orifice and a left retrograde pyelogram was then performed in the standard fashion by injecting full-strength Omnipaque contrast through the open-ended catheter and into the left ureter.  I noted a markedly dilated ureter down to the level of the intramural ureter.  It was smooth-walled and there were no filling defects.  I advanced the open-ended catheter on out of the ureter while injecting contrast and noted no real obstruction but the ureter was dilated down to the level of the bladder wall.  I then observed the ureteral orifice and noted reflux of contrast and also under fluoroscopy noted contrast jets emanating from the left ureteral orifice indicating no evidence of obstruction.  I then inserted the 1000 m holmium laser fiber and use this to fragment all of the stones.  The Microvasive evacuator was used to evacuate all stone fragments and reinspection revealed intact mucosa, no stones and no significant bleeding.  I therefore drained the bladder, removed the cystoscope and the patient was awakened and taken to the recovery room in stable and satisfactory condition.  He tolerated the procedure well with no intraoperative complications.  PLAN OF CARE: Discharge to home after PACU  PATIENT DISPOSITION:  PACU - hemodynamically stable.

## 2018-10-03 NOTE — Discharge Instructions (Signed)
Cystoscopy patient instructions  Following a cystoscopy, a catheter (a flexible rubber tube) is sometimes left in place to empty the bladder. This may cause some discomfort or a feeling that you need to urinate. Your doctor determines the period of time that the catheter will be left in place. You may have bloody urine for two to three days (Call your doctor if the amount of bleeding increases or does not subside).  You may pass blood clots in your urine, especially if you had a biopsy. It is not unusual to pass small blood clots and have some bloody urine a couple of weeks after your cystoscopy. Again, call your doctor if the bleeding does not subside. You may have: Dysuria (painful urination) Frequency (urinating often) Urgency (strong desire to urinate)  These symptoms are common especially if medicine is instilled into the bladder or a ureteral stent is placed. Avoiding alcohol and caffeine, such as coffee, tea, and chocolate, may help relieve these symptoms. Drink plenty of water, unless otherwise instructed. Your doctor may also prescribe an antibiotic or other medicine to reduce these symptoms.  Cystoscopy results are available soon after the procedure; biopsy results usually take two to four days. Your doctor will discuss the results of your exam with you. Before you go home, you will be given specific instructions for follow-up care. Special Instructions:  1 If you are going home with a catheter in place do not take a tub bath until removed by your doctor.  2 You may resume your normal activities.  3 Do not drive or operate machinery if you are taking narcotic pain medicine.  4 Be sure to keep all follow-up appointments with your doctor.   5 Call Your Doctor If: The catheter is not draining  You have severe pain  You are unable to urinate  You have a fever over 101  You have severe bleeding      NO ADVIL, ALEVE, MOTRIN, IBUPROFEN UNTIL 4 PM TODAY   Indwelling Urinary  Catheter Care, Adult An indwelling urinary catheter is a thin tube that is put into your bladder. The tube helps to drain pee (urine) out of your body. The tube goes in through your urethra. Your urethra is where pee comes out of your body. Your pee will come out through the catheter, then it will go into a bag (drainage bag). Take good care of your catheter so it will work well. How to wear your catheter and bag Supplies needed  Sticky tape (adhesive tape) or a leg strap.  Alcohol wipe or soap and water (if you use tape).  A clean towel (if you use tape).  Large overnight bag.  Smaller bag (leg bag). Wearing your catheter Attach your catheter to your leg with tape or a leg strap.  Make sure the catheter is not pulled tight.  If a leg strap gets wet, take it off and put on a dry strap.  If you use tape to hold the bag on your leg: 1. Use an alcohol wipe or soap and water to wash your skin where the tape made it sticky before. 2. Use a clean towel to pat-dry that skin. 3. Use new tape to make the bag stay on your leg. Wearing your bags You should have been given a large overnight bag.  You may wear the overnight bag in the day or night.  Always have the overnight bag lower than your bladder.  Do not let the bag touch the floor.  Before  you go to sleep, put a clean plastic bag in a wastebasket. Then hang the overnight bag inside the wastebasket. You should also have a smaller leg bag that fits under your clothes.  Always wear the leg bag below your knee.  Do not wear your leg bag at night. How to care for your skin and catheter Supplies needed  A clean washcloth.  Water and mild soap.  A clean towel. Caring for your skin and catheter      Clean the skin around your catheter every day: ? Wash your hands with soap and water. ? Wet a clean washcloth in warm water and mild soap. ? Clean the skin around your urethra. ? If you are male: ? Gently spread the folds of  skin around your vagina (labia). ? With the washcloth in your other hand, wipe the inner side of your labia on each side. Wipe from front to back. ? If you are male: ? Pull back any skin that covers the end of your penis (foreskin). ? With the washcloth in your other hand, wipe your penis in small circles. Start wiping at the tip of your penis, then move away from the catheter. ? With your free hand, hold the catheter close to where it goes into your body. ? Keep holding the catheter during cleaning so it does not get pulled out. ? With the washcloth in your other hand, clean the catheter. ? Only wipe downward on the catheter. ? Do not wipe upward toward your body. Doing this may push germs into your urethra and cause infection. ? Use a clean towel to pat-dry the catheter and the skin around it. Make sure to wipe off all soap. ? Wash your hands with soap and water.  Shower every day. Do not take baths.  Do not use cream, ointment, or lotion on the area where the catheter goes into your body, unless your doctor tells you to.  Do not use powders, sprays, or lotions on your genital area.  Check your skin around the catheter every day for signs of infection. Check for: ? Redness, swelling, or pain. ? Fluid or blood. ? Warmth. ? Pus or a bad smell. How to empty the bag Supplies needed  Rubbing alcohol.  Gauze pad or cotton ball.  Tape or a leg strap. Emptying the bag Pour the pee out of your bag when it is ?- full, or at least 2-3 times a day. Do this for your overnight bag and your leg bag. 1. Wash your hands with soap and water. 2. Separate (detach) the bag from your leg. 3. Hold the bag over the toilet or a clean pail. Keep the bag lower than your hips and bladder. This is so the pee (urine) does not go back into the tube. 4. Open the pour spout. It is at the bottom of the bag. 5. Empty the pee into the toilet or pail. Do not let the pour spout touch any surface. 6. Put rubbing  alcohol on a gauze pad or cotton ball. 7. Use the gauze pad or cotton ball to clean the pour spout. 8. Close the pour spout. 9. Attach the bag to your leg with tape or a leg strap. 10. Wash your hands with soap and water. Follow instructions for cleaning the drainage bag:  From the product maker.  As told by your doctor. How to change the bag Supplies needed  Alcohol wipes.  A clean bag.  Tape or a leg strap.  Changing the bag Replace your bag with a clean bag once a month. If it starts to leak, smell bad, or look dirty, change it sooner. 1. Wash your hands with soap and water. 2. Separate the dirty bag from your leg. 3. Pinch the catheter with your fingers so that pee does not spill out. 4. Separate the catheter tube from the bag tube where these tubes connect (at the connection valve). Do not let the tubes touch any surface. 5. Clean the end of the catheter tube with an alcohol wipe. Use a different alcohol wipe to clean the end of the bag tube. 6. Connect the catheter tube to the tube of the clean bag. 7. Attach the clean bag to your leg with tape or a leg strap. Do not make the bag tight on your leg. 8. Wash your hands with soap and water. General rules   Never pull on your catheter. Never try to take it out. Doing that can hurt you.  Always wash your hands before and after you touch your catheter or bag. Use a mild, fragrance-free soap. If you do not have soap and water, use hand sanitizer.  Always make sure there are no twists or bends (kinks) in the catheter tube.  Always make sure there are no leaks in the catheter or bag.  Drink enough fluid to keep your pee pale yellow.  Do not take baths, swim, or use a hot tub.  If you are male, wipe from front to back after you poop (have a bowel movement). Contact a doctor if:  Your pee is cloudy.  Your pee smells worse than usual.  Your catheter gets clogged.  Your catheter leaks.  Your bladder feels full. Get  help right away if:  You have redness, swelling, or pain where the catheter goes into your body.  You have fluid, blood, pus, or a bad smell coming from the area where the catheter goes into your body.  Your skin feels warm where the catheter goes into your body.  You have a fever.  You have pain in your: ? Belly (abdomen). ? Legs. ? Lower back. ? Bladder.  You see blood in the catheter.  Your pee is pink or red.  You feel sick to your stomach (nauseous).  You throw up (vomit).  You have chills.  Your pee is not draining into the bag.  Your catheter gets pulled out. Summary  An indwelling urinary catheter is a thin tube that is placed into the bladder to help drain pee (urine) out of the body.  The catheter is placed into the part of the body that drains pee from the bladder (urethra).  Taking good care of your catheter will keep it working properly and help prevent problems.  Always wash your hands before and after touching your catheter or bag.  Never pull on your catheter or try to take it out. This information is not intended to replace advice given to you by your health care provider. Make sure you discuss any questions you have with your health care provider. Document Released: 10/27/2012 Document Revised: 12/23/2017 Document Reviewed: 02/15/2017 Elsevier Interactive Patient Education  2019 Winter Anesthesia Home Care Instructions  Activity: Get plenty of rest for the remainder of the day. A responsible adult should stay with you for 24 hours following the procedure.  For the next 24 hours, DO NOT: -Drive a car -Paediatric nurse -Drink alcoholic beverages -Take  any medication unless instructed by your physician -Make any legal decisions or sign important papers.  Meals: Start with liquid foods such as gelatin or soup. Progress to regular foods as tolerated. Avoid greasy, spicy, heavy foods. If nausea and/or vomiting occur, drink  only clear liquids until the nausea and/or vomiting subsides. Call your physician if vomiting continues.  Special Instructions/Symptoms: Your throat may feel dry or sore from the anesthesia or the breathing tube placed in your throat during surgery. If this causes discomfort, gargle with warm salt water. The discomfort should disappear within 24 hours.  If you had a scopolamine patch placed behind your ear for the management of post- operative nausea and/or vomiting:  1. The medication in the patch is effective for 72 hours, after which it should be removed.  Wrap patch in a tissue and discard in the trash. Wash hands thoroughly with soap and water. 2. You may remove the patch earlier than 72 hours if you experience unpleasant side effects which may include dry mouth, dizziness or visual disturbances. 3. Avoid touching the patch. Wash your hands with soap and water after contact with the patch.

## 2018-10-03 NOTE — Anesthesia Procedure Notes (Signed)
Procedure Name: LMA Insertion Date/Time: 10/03/2018 9:27 AM Performed by: Justice Rocher, CRNA Pre-anesthesia Checklist: Patient identified, Emergency Drugs available, Suction available and Patient being monitored Patient Re-evaluated:Patient Re-evaluated prior to induction Oxygen Delivery Method: Circle system utilized Preoxygenation: Pre-oxygenation with 100% oxygen Induction Type: IV induction Ventilation: Mask ventilation without difficulty LMA: LMA inserted LMA Size: 5.0 Number of attempts: 1 Airway Equipment and Method: Bite block Placement Confirmation: positive ETCO2 and breath sounds checked- equal and bilateral Tube secured with: Tape Dental Injury: Teeth and Oropharynx as per pre-operative assessment

## 2018-10-03 NOTE — Anesthesia Postprocedure Evaluation (Signed)
Anesthesia Post Note  Patient: Hunter Stone  Procedure(s) Performed: CYSTOSCOPY WITH LITHOLAPAXY/LEFT RETROGRADE (N/A ) HOLMIUM LASER APPLICATION LITHIOPAXY     Patient location during evaluation: PACU Anesthesia Type: General Level of consciousness: awake and alert Pain management: pain level controlled Vital Signs Assessment: post-procedure vital signs reviewed and stable Respiratory status: spontaneous breathing, nonlabored ventilation and respiratory function stable Cardiovascular status: blood pressure returned to baseline and stable Postop Assessment: no apparent nausea or vomiting Anesthetic complications: no    Last Vitals:  Vitals:   10/03/18 1030 10/03/18 1045  BP: (!) 122/59 115/72  Pulse: 86 81  Resp: 13 17  Temp:    SpO2: 100% 97%    Last Pain:  Vitals:   10/03/18 1130  TempSrc:   PainSc: 0-No pain                 Lidia Collum

## 2018-10-06 ENCOUNTER — Encounter (HOSPITAL_BASED_OUTPATIENT_CLINIC_OR_DEPARTMENT_OTHER): Payer: Self-pay | Admitting: Urology

## 2018-10-06 DIAGNOSIS — R3914 Feeling of incomplete bladder emptying: Secondary | ICD-10-CM | POA: Diagnosis not present

## 2018-10-06 DIAGNOSIS — N401 Enlarged prostate with lower urinary tract symptoms: Secondary | ICD-10-CM | POA: Diagnosis not present

## 2018-11-06 DIAGNOSIS — N401 Enlarged prostate with lower urinary tract symptoms: Secondary | ICD-10-CM | POA: Diagnosis not present

## 2018-11-06 DIAGNOSIS — R3914 Feeling of incomplete bladder emptying: Secondary | ICD-10-CM | POA: Diagnosis not present

## 2018-11-10 DIAGNOSIS — N210 Calculus in bladder: Secondary | ICD-10-CM | POA: Diagnosis not present

## 2018-12-01 ENCOUNTER — Other Ambulatory Visit: Payer: Self-pay | Admitting: Urology

## 2018-12-15 HISTORY — PX: DENTAL SURGERY: SHX609

## 2018-12-18 DIAGNOSIS — N401 Enlarged prostate with lower urinary tract symptoms: Secondary | ICD-10-CM | POA: Diagnosis not present

## 2018-12-18 DIAGNOSIS — M6208 Separation of muscle (nontraumatic), other site: Secondary | ICD-10-CM | POA: Diagnosis not present

## 2018-12-18 DIAGNOSIS — R7303 Prediabetes: Secondary | ICD-10-CM | POA: Diagnosis not present

## 2018-12-18 DIAGNOSIS — I10 Essential (primary) hypertension: Secondary | ICD-10-CM | POA: Diagnosis not present

## 2018-12-18 DIAGNOSIS — E782 Mixed hyperlipidemia: Secondary | ICD-10-CM | POA: Diagnosis not present

## 2018-12-18 DIAGNOSIS — J309 Allergic rhinitis, unspecified: Secondary | ICD-10-CM | POA: Diagnosis not present

## 2018-12-18 DIAGNOSIS — G4733 Obstructive sleep apnea (adult) (pediatric): Secondary | ICD-10-CM | POA: Diagnosis not present

## 2018-12-22 DIAGNOSIS — M25561 Pain in right knee: Secondary | ICD-10-CM | POA: Diagnosis not present

## 2018-12-22 DIAGNOSIS — M1711 Unilateral primary osteoarthritis, right knee: Secondary | ICD-10-CM | POA: Diagnosis not present

## 2019-01-01 ENCOUNTER — Other Ambulatory Visit (HOSPITAL_COMMUNITY)
Admission: RE | Admit: 2019-01-01 | Discharge: 2019-01-01 | Disposition: A | Discharge status: Home / Self Care | Payer: Medicare Other | Source: Ambulatory Visit | Attending: Urology | Admitting: Urology

## 2019-01-01 DIAGNOSIS — Z1159 Encounter for screening for other viral diseases: Secondary | ICD-10-CM | POA: Insufficient documentation

## 2019-01-01 LAB — SARS CORONAVIRUS 2 (TAT 6-24 HRS): SARS Coronavirus 2: NEGATIVE

## 2019-01-01 NOTE — H&P (Signed)
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  Patient is currently treated with Tamsulosin 0.8 mg for his symptoms. The patient complains of lower urinary tract symptom(s) that include frequency and sense of incomplete emptying. The patient states his most bothersome symptom(s) are the following: frequency.   He complains of other associated symptom(s). The patient states if he were to spend the rest of his life with his current urinary condition, he would be unhappy. His urinary symptoms have been worse since his last office visit. This condition would be considered of mild to moderate severity with no modifying factors or associated signs or symptoms other than as noted above.   10/06/18: Workup of gross hematuria revealed large bladder calculi and he underwent cystolitholapaxy on 10/03/18. Postoperatively he was unable to urinate and a Foley catheter was inserted. He remove the catheter this morning and initially voided this morning but since then has been urinating frequently and only about 30-40 cc at a time. He does have a sense of urgency. He does not have any pain. He denies dysuria. He has known BPH and was taking tamsulosin which I told him to increase to 0.8 mg. I noted significant BPH at the time of his surgery.   11/06/18: He has returned today having begun self catheterization after developing urinary retention. He has begun to void a very small amount but still requires self catheterization. No infections. He remains on tamsulosin 0.8 mg.     AUA Symptom Score: Almost always he has the sensation of not emptying his bladder completely when finished urinating. 50% of the time he has to urinate again fewer than two hours after he has finished urinating. 50% of the time he has to start and stop again several times when he urinates. Less than 20% of the time he finds it difficult to postpone urination. Almost always he has a weak urinary stream. Less than 50% of  the time he has to push or strain to begin urination. He has to get up to urinate 1 time from the time he goes to bed until the time he gets up in the morning.   Calculated AUA Symptom Score: 20    ALLERGIES: No Known Drug Allergies    MEDICATIONS: Doxycycline Hyclate 100 mg tablet  Tamsulosin Hcl 0.4 mg capsule 2 capsule PO Q HS  Tamsulosin Hcl 0.4 mg capsule  Viagra 50 mg tablet  Zyrtec 10 mg tablet  Aspirin Ec 81 mg tablet, delayed release  Benicar Hct 20 mg-12.5 mg tablet  Cinnamon 500 mg capsule  Coq-10 30 mg capsule  Rosuvastatin Calcium 10 mg tablet  Vitamin D3 1,000 unit capsule     GU PSH: Cysto Bladder Stone >2.5cm - 10/03/2018 Cystoscopy - 09/23/2018 Locm 300-399Mg /Ml Iodine,1Ml - 09/18/2018    NON-GU PSH: Anesth, Achilles Tendon Surg - about 2015 Remove Lesion Neck/chest - about 2018 Repair Biceps Tendon, Right - about 09/13/2017    GU PMH: Incomplete bladder emptying, He is going to begin CIC 4 times a day and HS. He will then return in 4 weeks for reassessment and if he is not voiding spontaneously at that time will need to discuss surgery. - 10/06/2018 Gross hematuria (Stable), His gross hematuria appears to be secondary to his bladder calculi. Found no other worrisome lesions within the bladder. - 09/23/2018, He has experienced gross hematuria and has some slight voiding symptoms. In addition he has been exposed to industrial solvents in the past. I therefore  have discussed the need for a full evaluation of the source of his hematuria with a CT scan as well as lower tract evaluation with cystoscopy., - 09/16/2018 BPH w/LUTS, He does have BPH with some voiding symptoms. He is on tamsulosin. - 09/16/2018 Hydronephrosis, Left, He did appear to have left hydronephrosis on a previous CT scan 5 years ago. That will be reassessed at the time of his CT scan. - 09/16/2018 Urinary Frequency, He reports he seen some slight increased frequency, increased nocturia and some increased urgency.  - 09/16/2018 Bladder Stone      PMH Notes: Bladder calculus: He experienced gross hematuria and workup revealed a large bladder calculus which was treated with cystolitholapaxy on 10/03/18.   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   Left hydronephrosis: He was found to have left hydronephrosis on a CT scan on 09/18/18 done for workup of gross hematuria. This was evaluated further with retrograde pyelogram which revealed dilated ureter down to the intramural ureter however there was no obstruction and effluxing urine noted. Possibly adynamic segment.   NON-GU PMH: Achilles tendinitis, right leg Allergic rhinitis, unspecified Carpal tunnel syndrome, unspecified upper limb Hypertension Mixed hyperlipidemia Obstructive sleep apnea (adult) (pediatric) Prediabetes Sciatica, right side Separation of muscle (nontraumatic), other site    FAMILY HISTORY: Heart Attack - Grandfather Huntington Disease - Father Lung Cancer - Mother   SOCIAL HISTORY: Marital Status: Married Preferred Language: English Current Smoking Status: Patient has never smoked.   Tobacco Use Assessment Completed: Used Tobacco in last 30 days? Does not use smokeless tobacco. Drinks 2 drinks per day.  Does not use drugs. Drinks 2 caffeinated drinks per day.    REVIEW OF SYSTEMS:    GU Review Male:   Patient denies frequent urination, hard to postpone urination, burning/ pain with urination, get up at night to urinate, leakage of urine, stream starts and stops, trouble starting your stream, have to strain to urinate , erection problems, and penile pain.  Gastrointestinal (Upper):   Patient denies nausea, vomiting, and indigestion/ heartburn.  Gastrointestinal (Lower):   Patient denies diarrhea and constipation.  Constitutional:   Patient denies fever, night sweats, weight loss, and fatigue.  Skin:   Patient denies  skin rash/ lesion and itching.  Eyes:   Patient denies blurred vision and double vision.  Ears/ Nose/ Throat:   Patient denies sinus problems and sore throat.  Hematologic/Lymphatic:   Patient denies swollen glands and easy bruising.  Cardiovascular:   Patient denies leg swelling and chest pains.  Respiratory:   Patient denies cough and shortness of breath.  Endocrine:   Patient denies excessive thirst.  Musculoskeletal:   Patient denies back pain and joint pain.  Neurological:   Patient denies headaches and dizziness.  Psychologic:   Patient denies depression and anxiety.   VITAL SIGNS:    Weight 190 lb / 86.18 kg  Height 66 in / 167.64 cm  BP 128/75 mmHg  Pulse 68 /min  Temperature 97.9 F / 36.6 C  BMI 30.7 kg/m   GU PHYSICAL EXAMINATION:    Scrotum: No lesions. No edema. No cysts. No warts.  Epididymides: Right: no spermatocele, no masses, no cysts, no tenderness, no induration, no enlargement. Left: no spermatocele, no masses, no cysts, no tenderness, no induration, no enlargement.  Testes: No tenderness, no swelling, no enlargement left testes. No tenderness, no swelling, no enlargement right testes. Normal location left testes.  Normal location right testes. No mass, no cyst, no varicocele, no hydrocele left testes. No mass, no cyst, no varicocele, no hydrocele right testes.  Urethral Meatus: Normal size. No lesion, no wart, no discharge, no polyp. Normal location.  Penis: Circumcised, no warts, no cracks. No dorsal Peyronie's plaques, no left corporal Peyronie's plaques, no right corporal Peyronie's plaques, no scarring, no warts. No balanitis, no meatal stenosis.   MULTI-SYSTEM PHYSICAL EXAMINATION:    Constitutional: Well-nourished. No physical deformities. Normally developed. Good grooming.  Neck: Neck symmetrical, not swollen. Normal tracheal position.  Respiratory: No labored breathing, no use of accessory muscles.   Cardiovascular: Normal temperature, normal extremity  pulses, no swelling, no varicosities.  Lymphatic: No enlargement of neck, axillae, groin.  Skin: No paleness, no jaundice, no cyanosis. No lesion, no ulcer, no rash.  Neurologic / Psychiatric: Oriented to time, oriented to place, oriented to person. No depression, no anxiety, no agitation.  Gastrointestinal: No mass, no tenderness, no rigidity, non obese abdomen.  Eyes: Normal conjunctivae. Normal eyelids.  Ears, Nose, Mouth, and Throat: Left ear no scars, no lesions, no masses. Right ear no scars, no lesions, no masses. Nose no scars, no lesions, no masses. Normal hearing. Normal lips.  Musculoskeletal: Normal gait and station of head and neck.    PAST DATA REVIEWED:  Source Of History:  Patient  Records Review:   Previous Patient Records, POC Tool   07/13/05  PSA  Total PSA 0.49     PROCEDURES:         PVR Ultrasound - 01751  Scanned Volume: 40 cc  Notes: I note no significant elevation of his PVR.         Urinalysis w/Scope - 81001 Dipstick Dipstick Cont'd Micro  Color: Yellow Bilirubin: Neg WBC/hpf: 10 - 20/hpf  Appearance: Cloudy Ketones: Neg RBC/hpf: 0 - 2/hpf  Specific Gravity: 1.025 Blood: Trace Bacteria: Few (10-25/hpf)  pH: <=5.0 Protein: Neg Cystals: NS (Not Seen)  Glucose: Neg Urobilinogen: 0.2 Casts: NS (Not Seen)    Nitrites: Neg Trichomonas: Not Present    Leukocyte Esterase: 1+ Mucous: Not Present      Epithelial Cells: NS (Not Seen)      Yeast: NS (Not Seen)      Sperm: Not Present     ASSESSMENT/PLAN:      ICD-10 Details  1 GU:   Incomplete bladder emptying - R39.14 Stable - He has not begun to void spontaneously and therefore has elected to proceed 1st with medical management while we await the ability to perform surgical procedures that are elective due to the COVID-19 virus. He would like to begin finasteride and then will return in 3 months for reassessment.  2   BPH w/LUTS - N40.1 Stable - His prostate is markedly enlarged. I am going to start  finasteride and keep him on tamsulosin.              Notes:   We had a long discussion today about the fact that he has not begun to void spontaneously. He did develop bladder calculi and we did discuss the fact that this often occurs due to outlet obstruction and incomplete bladder emptying which may have been occurring for some time but now has worsened to the point where he is not voiding spontaneously despite the fact that he is taking tamsulosin 0.8 mg.  The options we discussed would be continued self catheterization with maximum medical management using finasteride. We discussed its mechanism of action and the side effects.  I then also discussed various options for outlet resistance surgery including an open prostatectomy, transurethral resection of the prostate verses laser ablation of the prostate and also possible Urolift although the Urolift may require 2 procedures and I told him that a transurethral resection might also have to be staged due to his large prostate.   He has elected to proceed with a transurethral resection of the prostate.

## 2019-01-02 ENCOUNTER — Encounter (HOSPITAL_BASED_OUTPATIENT_CLINIC_OR_DEPARTMENT_OTHER): Payer: Self-pay

## 2019-01-02 ENCOUNTER — Other Ambulatory Visit: Payer: Self-pay

## 2019-01-02 NOTE — Progress Notes (Signed)
Spoke with: Arnell Sieving NPO:  After Midnight, no gum, candy, or mints   Arrival time: 0530AM Labs: Istat 8 (EKG chart/epic) AM medications: Finasteride Pre op orders: Yes Ride home:   Anda Kraft (wife) [phone]

## 2019-01-02 NOTE — Progress Notes (Signed)
SPOKE W/  Hunter Stone     SCREENING SYMPTOMS OF COVID 19:   COUGH--NO  RUNNY NOSE--- NO  SORE THROAT---NO  NASAL CONGESTION----NO  SNEEZING----NO  SHORTNESS OF BREATH---NO  DIFFICULTY BREATHING---NO  TEMP >100.0 -----NO  UNEXPLAINED BODY ACHES------NO  CHILLS -------- NO  HEADACHES ---------NO  LOSS OF SMELL/ TASTE --------NO    HAVE YOU OR ANY FAMILY MEMBER TRAVELLED PAST 14 DAYS OUT OF THE   COUNTY---NO STATE----NO COUNTRY----NO  HAVE YOU OR ANY FAMILY MEMBER BEEN EXPOSED TO ANYONE WITH COVID 19? NO

## 2019-01-05 ENCOUNTER — Ambulatory Visit (HOSPITAL_BASED_OUTPATIENT_CLINIC_OR_DEPARTMENT_OTHER): Payer: Medicare Other | Admitting: Anesthesiology

## 2019-01-05 ENCOUNTER — Encounter (HOSPITAL_BASED_OUTPATIENT_CLINIC_OR_DEPARTMENT_OTHER): Payer: Self-pay | Admitting: *Deleted

## 2019-01-05 ENCOUNTER — Ambulatory Visit (HOSPITAL_BASED_OUTPATIENT_CLINIC_OR_DEPARTMENT_OTHER)
Admission: RE | Admit: 2019-01-05 | Discharge: 2019-01-06 | Disposition: A | Discharge status: Home / Self Care | Payer: Medicare Other | Attending: Urology | Admitting: Urology

## 2019-01-05 ENCOUNTER — Other Ambulatory Visit: Payer: Self-pay

## 2019-01-05 ENCOUNTER — Encounter (HOSPITAL_BASED_OUTPATIENT_CLINIC_OR_DEPARTMENT_OTHER)
Admission: RE | Disposition: A | Discharge status: Home / Self Care | Payer: Self-pay | Source: Home / Self Care | Attending: Urology

## 2019-01-05 DIAGNOSIS — E782 Mixed hyperlipidemia: Secondary | ICD-10-CM | POA: Diagnosis not present

## 2019-01-05 DIAGNOSIS — G4733 Obstructive sleep apnea (adult) (pediatric): Secondary | ICD-10-CM | POA: Insufficient documentation

## 2019-01-05 DIAGNOSIS — Z801 Family history of malignant neoplasm of trachea, bronchus and lung: Secondary | ICD-10-CM | POA: Diagnosis not present

## 2019-01-05 DIAGNOSIS — Z8489 Family history of other specified conditions: Secondary | ICD-10-CM | POA: Diagnosis not present

## 2019-01-05 DIAGNOSIS — N320 Bladder-neck obstruction: Secondary | ICD-10-CM | POA: Diagnosis not present

## 2019-01-05 DIAGNOSIS — Z8249 Family history of ischemic heart disease and other diseases of the circulatory system: Secondary | ICD-10-CM | POA: Diagnosis not present

## 2019-01-05 DIAGNOSIS — N400 Enlarged prostate without lower urinary tract symptoms: Secondary | ICD-10-CM | POA: Diagnosis not present

## 2019-01-05 DIAGNOSIS — R7303 Prediabetes: Secondary | ICD-10-CM | POA: Insufficient documentation

## 2019-01-05 DIAGNOSIS — Z79899 Other long term (current) drug therapy: Secondary | ICD-10-CM | POA: Insufficient documentation

## 2019-01-05 DIAGNOSIS — N138 Other obstructive and reflux uropathy: Secondary | ICD-10-CM | POA: Diagnosis not present

## 2019-01-05 DIAGNOSIS — G4730 Sleep apnea, unspecified: Secondary | ICD-10-CM | POA: Diagnosis not present

## 2019-01-05 DIAGNOSIS — Z87442 Personal history of urinary calculi: Secondary | ICD-10-CM | POA: Insufficient documentation

## 2019-01-05 DIAGNOSIS — I10 Essential (primary) hypertension: Secondary | ICD-10-CM | POA: Insufficient documentation

## 2019-01-05 DIAGNOSIS — Z7982 Long term (current) use of aspirin: Secondary | ICD-10-CM | POA: Diagnosis not present

## 2019-01-05 DIAGNOSIS — K279 Peptic ulcer, site unspecified, unspecified as acute or chronic, without hemorrhage or perforation: Secondary | ICD-10-CM | POA: Diagnosis not present

## 2019-01-05 DIAGNOSIS — N401 Enlarged prostate with lower urinary tract symptoms: Secondary | ICD-10-CM | POA: Insufficient documentation

## 2019-01-05 DIAGNOSIS — M5431 Sciatica, right side: Secondary | ICD-10-CM | POA: Insufficient documentation

## 2019-01-05 DIAGNOSIS — R338 Other retention of urine: Secondary | ICD-10-CM | POA: Diagnosis not present

## 2019-01-05 HISTORY — DX: Personal history of other diseases of urinary system: Z87.448

## 2019-01-05 HISTORY — PX: TRANSURETHRAL RESECTION OF PROSTATE: SHX73

## 2019-01-05 HISTORY — DX: Personal history of other specified conditions: Z87.898

## 2019-01-05 LAB — POCT I-STAT, CHEM 8
BUN: 23 mg/dL (ref 8–23)
Calcium, Ion: 1.3 mmol/L (ref 1.15–1.40)
Chloride: 103 mmol/L (ref 98–111)
Creatinine, Ser: 1.1 mg/dL (ref 0.61–1.24)
Glucose, Bld: 112 mg/dL — ABNORMAL HIGH (ref 70–99)
HCT: 41 % (ref 39.0–52.0)
Hemoglobin: 13.9 g/dL (ref 13.0–17.0)
Potassium: 3.6 mmol/L (ref 3.5–5.1)
Sodium: 140 mmol/L (ref 135–145)
TCO2: 27 mmol/L (ref 22–32)

## 2019-01-05 SURGERY — TURP (TRANSURETHRAL RESECTION OF PROSTATE)
Anesthesia: General | Site: Prostate

## 2019-01-05 MED ORDER — PROMETHAZINE HCL 25 MG/ML IJ SOLN
6.2500 mg | INTRAMUSCULAR | Status: DC | PRN
  Filled 2019-01-05: qty 1

## 2019-01-05 MED ORDER — PHENYLEPHRINE 40 MCG/ML (10ML) SYRINGE FOR IV PUSH (FOR BLOOD PRESSURE SUPPORT)
PREFILLED_SYRINGE | INTRAVENOUS | Status: DC | PRN
  Administered 2019-01-05 (×3): 80 ug via INTRAVENOUS
  Administered 2019-01-05: 160 ug via INTRAVENOUS

## 2019-01-05 MED ORDER — ONDANSETRON HCL 4 MG/2ML IJ SOLN
4.0000 mg | INTRAMUSCULAR | Status: DC | PRN
  Filled 2019-01-05: qty 2

## 2019-01-05 MED ORDER — ACETAMINOPHEN 325 MG PO TABS
650.0000 mg | ORAL_TABLET | ORAL | Status: DC | PRN
  Filled 2019-01-05: qty 2

## 2019-01-05 MED ORDER — CIPROFLOXACIN IN D5W 400 MG/200ML IV SOLN
INTRAVENOUS | Status: AC
  Filled 2019-01-05: qty 200

## 2019-01-05 MED ORDER — PROPOFOL 10 MG/ML IV BOLUS
INTRAVENOUS | Status: AC
  Filled 2019-01-05: qty 40

## 2019-01-05 MED ORDER — FENTANYL CITRATE (PF) 100 MCG/2ML IJ SOLN
INTRAMUSCULAR | Status: DC | PRN
  Administered 2019-01-05: 50 ug via INTRAVENOUS
  Administered 2019-01-05: 25 ug via INTRAVENOUS

## 2019-01-05 MED ORDER — ONDANSETRON HCL 4 MG/2ML IJ SOLN
INTRAMUSCULAR | Status: AC
  Filled 2019-01-05: qty 2

## 2019-01-05 MED ORDER — OXYCODONE HCL 5 MG/5ML PO SOLN
5.0000 mg | Freq: Once | ORAL | Status: DC | PRN
  Filled 2019-01-05: qty 5

## 2019-01-05 MED ORDER — SODIUM CHLORIDE 0.9 % IV SOLN
INTRAVENOUS | Status: DC
  Administered 2019-01-05: 100 mL/h via INTRAVENOUS
  Filled 2019-01-05 (×2): qty 1000

## 2019-01-05 MED ORDER — ROSUVASTATIN CALCIUM 10 MG PO TABS
10.0000 mg | ORAL_TABLET | Freq: Every day | ORAL | Status: DC
  Administered 2019-01-05: 20:00:00 10 mg via ORAL
  Filled 2019-01-05: qty 1

## 2019-01-05 MED ORDER — DEXAMETHASONE SODIUM PHOSPHATE 4 MG/ML IJ SOLN
INTRAMUSCULAR | Status: DC | PRN
  Administered 2019-01-05: 4 mg via INTRAVENOUS

## 2019-01-05 MED ORDER — LIDOCAINE 2% (20 MG/ML) 5 ML SYRINGE
INTRAMUSCULAR | Status: AC
  Filled 2019-01-05: qty 5

## 2019-01-05 MED ORDER — CEFAZOLIN SODIUM-DEXTROSE 2-4 GM/100ML-% IV SOLN
2.0000 g | Freq: Once | INTRAVENOUS | Status: AC
  Administered 2019-01-05: 08:00:00 2 g via INTRAVENOUS
  Filled 2019-01-05: qty 100

## 2019-01-05 MED ORDER — NON FORMULARY
20.0000 mg | Freq: Every evening | Status: DC
  Administered 2019-01-05: 20 mg via ORAL

## 2019-01-05 MED ORDER — EPHEDRINE 5 MG/ML INJ
INTRAVENOUS | Status: AC
  Filled 2019-01-05: qty 10

## 2019-01-05 MED ORDER — PHENYLEPHRINE 40 MCG/ML (10ML) SYRINGE FOR IV PUSH (FOR BLOOD PRESSURE SUPPORT)
PREFILLED_SYRINGE | INTRAVENOUS | Status: AC
  Filled 2019-01-05: qty 10

## 2019-01-05 MED ORDER — FENTANYL CITRATE (PF) 100 MCG/2ML IJ SOLN
INTRAMUSCULAR | Status: AC
  Filled 2019-01-05: qty 2

## 2019-01-05 MED ORDER — PROPOFOL 10 MG/ML IV BOLUS
INTRAVENOUS | Status: DC | PRN
  Administered 2019-01-05: 150 mg via INTRAVENOUS

## 2019-01-05 MED ORDER — SODIUM CHLORIDE 0.9 % IR SOLN
3000.0000 mL | Status: DC
  Filled 2019-01-05: qty 3000

## 2019-01-05 MED ORDER — BACITRACIN-NEOMYCIN-POLYMYXIN 400-5-5000 EX OINT
1.0000 "application " | TOPICAL_OINTMENT | Freq: Three times a day (TID) | CUTANEOUS | Status: DC | PRN
  Filled 2019-01-05: qty 1

## 2019-01-05 MED ORDER — LIDOCAINE 2% (20 MG/ML) 5 ML SYRINGE
INTRAMUSCULAR | Status: DC | PRN
  Administered 2019-01-05: 100 mg via INTRAVENOUS

## 2019-01-05 MED ORDER — HYDROCODONE-ACETAMINOPHEN 5-325 MG PO TABS
1.0000 | ORAL_TABLET | ORAL | Status: DC | PRN
  Filled 2019-01-05: qty 2

## 2019-01-05 MED ORDER — CIPROFLOXACIN IN D5W 400 MG/200ML IV SOLN
400.0000 mg | Freq: Two times a day (BID) | INTRAVENOUS | Status: DC
  Administered 2019-01-05 (×2): 400 mg via INTRAVENOUS
  Filled 2019-01-05: qty 200

## 2019-01-05 MED ORDER — MIDAZOLAM HCL 5 MG/5ML IJ SOLN
INTRAMUSCULAR | Status: DC | PRN
  Administered 2019-01-05: 2 mg via INTRAVENOUS

## 2019-01-05 MED ORDER — BELLADONNA ALKALOIDS-OPIUM 16.2-60 MG RE SUPP
1.0000 | Freq: Four times a day (QID) | RECTAL | Status: DC | PRN
  Filled 2019-01-05: qty 1

## 2019-01-05 MED ORDER — MIDAZOLAM HCL 2 MG/2ML IJ SOLN
INTRAMUSCULAR | Status: AC
  Filled 2019-01-05: qty 2

## 2019-01-05 MED ORDER — EPHEDRINE SULFATE-NACL 50-0.9 MG/10ML-% IV SOSY
PREFILLED_SYRINGE | INTRAVENOUS | Status: DC | PRN
  Administered 2019-01-05: 10 mg via INTRAVENOUS
  Administered 2019-01-05: 15 mg via INTRAVENOUS
  Administered 2019-01-05: 10 mg via INTRAVENOUS

## 2019-01-05 MED ORDER — HYDROMORPHONE HCL 1 MG/ML IJ SOLN
0.2500 mg | INTRAMUSCULAR | Status: DC | PRN
  Filled 2019-01-05: qty 0.5

## 2019-01-05 MED ORDER — LACTATED RINGERS IV SOLN
INTRAVENOUS | Status: DC
  Administered 2019-01-05 (×2): via INTRAVENOUS
  Administered 2019-01-05: 50 mL/h via INTRAVENOUS
  Filled 2019-01-05: qty 1000

## 2019-01-05 MED ORDER — OXYCODONE HCL 5 MG PO TABS
5.0000 mg | ORAL_TABLET | Freq: Once | ORAL | Status: DC | PRN
  Filled 2019-01-05: qty 1

## 2019-01-05 MED ORDER — CEFAZOLIN SODIUM-DEXTROSE 2-4 GM/100ML-% IV SOLN
INTRAVENOUS | Status: AC
  Filled 2019-01-05: qty 100

## 2019-01-05 MED ORDER — DEXAMETHASONE SODIUM PHOSPHATE 10 MG/ML IJ SOLN
INTRAMUSCULAR | Status: AC
  Filled 2019-01-05: qty 1

## 2019-01-05 MED ORDER — SODIUM CHLORIDE 0.9 % IR SOLN
Status: DC | PRN
  Administered 2019-01-05 (×3): 3000 mL via INTRAVESICAL
  Administered 2019-01-05: 6000 mL via INTRAVESICAL
  Administered 2019-01-05: 3000 mL via INTRAVESICAL

## 2019-01-05 MED ORDER — STERILE WATER FOR IRRIGATION IR SOLN
Status: DC | PRN
  Administered 2019-01-05: 1000 mL
  Administered 2019-01-05: 500 mL

## 2019-01-05 MED ORDER — ONDANSETRON HCL 4 MG/2ML IJ SOLN
INTRAMUSCULAR | Status: DC | PRN
  Administered 2019-01-05: 4 mg via INTRAVENOUS

## 2019-01-05 MED ORDER — ARTIFICIAL TEARS OPHTHALMIC OINT
TOPICAL_OINTMENT | OPHTHALMIC | Status: AC
  Filled 2019-01-05: qty 3.5

## 2019-01-05 SURGICAL SUPPLY — 33 items
BAG DRAIN URO-CYSTO SKYTR STRL (DRAIN) ×2 IMPLANT
BAG URINE DRAINAGE (UROLOGICAL SUPPLIES) ×1 IMPLANT
BAG URINE LEG 19OZ MD ST LTX (BAG) IMPLANT
CATH FOLEY 3WAY 20FR (CATHETERS) IMPLANT
CATH FOLEY 3WAY 30CC 20FR (CATHETERS) ×1 IMPLANT
CATH FOLEY 3WAY 30CC 24FR (CATHETERS)
CATH HEMA 3WAY 30CC 24FR COUDE (CATHETERS) IMPLANT
CATH HEMA 3WAY 30CC 24FR RND (CATHETERS) IMPLANT
CATH URTH STD 24FR FL 3W 2 (CATHETERS) IMPLANT
CLOTH BEACON ORANGE TIMEOUT ST (SAFETY) ×2 IMPLANT
ELECT REM PT RETURN 9FT ADLT (ELECTROSURGICAL)
ELECTRODE REM PT RTRN 9FT ADLT (ELECTROSURGICAL) IMPLANT
EVACUATOR MICROVAS BLADDER (UROLOGICAL SUPPLIES) ×2 IMPLANT
GLOVE BIO SURGEON STRL SZ7 (GLOVE) ×1 IMPLANT
GLOVE BIO SURGEON STRL SZ8 (GLOVE) ×2 IMPLANT
GLOVE BIOGEL PI IND STRL 6 (GLOVE) IMPLANT
GLOVE BIOGEL PI INDICATOR 6 (GLOVE) ×1
GOWN STRL REUS W/TWL LRG LVL3 (GOWN DISPOSABLE) IMPLANT
GOWN STRL REUS W/TWL XL LVL3 (GOWN DISPOSABLE) ×3 IMPLANT
HOLDER FOLEY CATH W/STRAP (MISCELLANEOUS) ×1 IMPLANT
IV NS IRRIG 3000ML ARTHROMATIC (IV SOLUTION) ×5 IMPLANT
KIT TURNOVER CYSTO (KITS) ×2 IMPLANT
LOOP CUT BIPOLAR 24F LRG (ELECTROSURGICAL) ×1 IMPLANT
MANIFOLD NEPTUNE II (INSTRUMENTS) ×2 IMPLANT
PACK CYSTO (CUSTOM PROCEDURE TRAY) ×2 IMPLANT
PLUG CATH AND CAP STER (CATHETERS) IMPLANT
SYR 30ML LL (SYRINGE) ×1 IMPLANT
SYRINGE IRR TOOMEY STRL 70CC (SYRINGE) IMPLANT
TUBE CONNECTING 12X1/4 (SUCTIONS) ×1 IMPLANT
TUBING UROLOGY SET (TUBING) ×1 IMPLANT
WATER STERILE IRR 1000ML POUR (IV SOLUTION) ×1 IMPLANT
WATER STERILE IRR 3000ML UROMA (IV SOLUTION) IMPLANT
WATER STERILE IRR 500ML POUR (IV SOLUTION) ×1 IMPLANT

## 2019-01-05 NOTE — Anesthesia Procedure Notes (Signed)
Procedure Name: LMA Insertion Date/Time: 01/05/2019 7:49 AM Performed by: Lynda Rainwater, MD Pre-anesthesia Checklist: Patient identified, Emergency Drugs available, Suction available and Patient being monitored Patient Re-evaluated:Patient Re-evaluated prior to induction Oxygen Delivery Method: Circle system utilized Preoxygenation: Pre-oxygenation with 100% oxygen Induction Type: IV induction Ventilation: Mask ventilation without difficulty LMA: LMA inserted LMA Size: 4.0 Number of attempts: 1 Airway Equipment and Method: Bite block Placement Confirmation: positive ETCO2 Tube secured with: Tape Dental Injury: Teeth and Oropharynx as per pre-operative assessment

## 2019-01-05 NOTE — Anesthesia Preprocedure Evaluation (Signed)
Anesthesia Evaluation  Patient identified by MRN, date of birth, ID band Patient awake    Reviewed: Allergy & Precautions, NPO status , Patient's Chart, lab work & pertinent test results  History of Anesthesia Complications Negative for: history of anesthetic complications  Airway Mallampati: III  TM Distance: >3 FB Neck ROM: Full    Dental no notable dental hx. (+) Teeth Intact   Pulmonary sleep apnea ,    Pulmonary exam normal        Cardiovascular hypertension, Pt. on medications Normal cardiovascular exam     Neuro/Psych negative neurological ROS  negative psych ROS   GI/Hepatic Neg liver ROS, PUD,   Endo/Other  negative endocrine ROS  Renal/GU negative Renal ROS  negative genitourinary   Musculoskeletal negative musculoskeletal ROS (+)   Abdominal   Peds  Hematology negative hematology ROS (+)   Anesthesia Other Findings BPH  Reproductive/Obstetrics                             Anesthesia Physical  Anesthesia Plan  ASA: II  Anesthesia Plan: General   Post-op Pain Management:    Induction: Intravenous  PONV Risk Score and Plan: 2 and Ondansetron, Treatment may vary due to age or medical condition and Midazolam  Airway Management Planned: LMA  Additional Equipment: None  Intra-op Plan:   Post-operative Plan: Extubation in OR  Informed Consent: I have reviewed the patients History and Physical, chart, labs and discussed the procedure including the risks, benefits and alternatives for the proposed anesthesia with the patient or authorized representative who has indicated his/her understanding and acceptance.     Dental advisory given  Plan Discussed with: CRNA and Anesthesiologist  Anesthesia Plan Comments:         Anesthesia Quick Evaluation

## 2019-01-05 NOTE — Op Note (Signed)
PATIENT:  Hunter Stone  PRE-OPERATIVE DIAGNOSIS: BPH with outlet obstruction  POST-OPERATIVE DIAGNOSIS: Same  PROCEDURE: TURP  SURGEON:  Claybon Jabs  INDICATION: Hunter Stone is a 68 year old male who has developed urinary retention secondary to BPH.  He has a history of bladder calculi and underwent cystolitholapaxy in 3/20.  Postoperatively he was unable to urinate and despite maximum medical management with tamsulosin at 0.8 mg he has been unable to urinate spontaneously.  He is performing self-catheterization.  He has elected to proceed with transurethral resection of his prostate.  I placed him on Augmentin preoperatively and he presents today for his TURP.  ANESTHESIA:  General  EBL:  Minimal  DRAINS: 20 [place], 30 cc three-way Foley catheter  SPECIMEN:  Prostate chips to pathology  After informed consent the patient was brought to the major OR and placed on the table. He was administered general anesthesia and then moved to the dorsal lithotomy position. His genitalia was sterilely prepped and draped and an official timeout was then performed.  Initially the 28 French resectoscope sheath with the visual obturator was passed into the bladder and the obturator removed. I then inserted the resectoscope element with 30 lens and  performed a systematic inspection of the bladder. I noted the bladder had 4+ trabeculation but was free of any tumor stones or inflammatory lesions.  There was a large, widemouth diverticulum just lateral to his left UO.  The ureteral orifices were noted to be of normal configuration and position. Withdrawing the scope into the prostatic urethra I noted obstructing bilobar hypertrophy with elongation of the prostatic urethra and a median lobe component as well as a very high, fixed bladder neck.  Resection was then begun I. first resected the median lobe in the midline down to the level of the bladder neck. I then began resecting the left lobe of the  prostate by resecting first from the level of the bladder neck back to the level of the Veru at the 5:00 position and then progressed in a counterclockwise direction resecting all of the adenomatous tissue of the left lobe down to the surgical capsule. Bleeding points were cauterized as they were encountered. I then turned my attention to the right lobe of the prostate and it was resected in an identical fashion. Tissue in the area of the apex was then resected circumferentially with care being taken to maintain the resection proximal to the Veru at all times. The prostatic chips were then flushed into the bladder and the Microvasive evacuator was then used to evacuate all chips from the bladder. Reinspection of the bladder revealed the mucosa to be intact, the ureteral orifices intact as well and well away from the bladder neck and area of resection. There were no prostatic chips remaining within the bladder. The prostatic capsule was intact throughout with no perforation and there was no active bleeding noted at the end of the procedure.  The resectoscope was therefore removed and the 20 French three-way Foley catheter was then inserted and balloon was filled to 30 cc. This was placed on mild traction and the bladder was irrigated with the irrigant returning clear. The catheter was then hooked to closed system drainage and continuous irrigation and the patient was awakened and taken to recovery room in stable and satisfactory condition. He tolerated the procedure well and there were no intraoperative complications.  PLAN OF CARE: Observation overnight with anticipated discharge in the morning.  PATIENT DISPOSITION:  PACU - Hemodynamically stable.

## 2019-01-05 NOTE — Anesthesia Postprocedure Evaluation (Signed)
Anesthesia Post Note  Patient: Hunter Stone  Procedure(s) Performed: TRANSURETHRAL RESECTION OF THE PROSTATE (TURP) (N/A Prostate)     Patient location during evaluation: PACU Anesthesia Type: General Level of consciousness: awake and alert Pain management: pain level controlled Vital Signs Assessment: post-procedure vital signs reviewed and stable Respiratory status: spontaneous breathing, nonlabored ventilation and respiratory function stable Cardiovascular status: blood pressure returned to baseline and stable Postop Assessment: no apparent nausea or vomiting Anesthetic complications: no    Last Vitals:  Vitals:   01/05/19 0915 01/05/19 0925  BP: 122/64 124/78  Pulse: 74 70  Resp: 15 18  Temp:  (!) 36.4 C  SpO2: 98% 98%    Last Pain:  Vitals:   01/05/19 0925  PainSc: 0-No pain                 Lynda Rainwater

## 2019-01-05 NOTE — Transfer of Care (Signed)
   Last Vitals:  Vitals Value Taken Time  BP 130/73 01/05/19 0845  Temp    Pulse 86 01/05/19 0846  Resp 12 01/05/19 0846  SpO2 100 % 01/05/19 0846  Vitals shown include unvalidated device data.  Last Pain:  Vitals:   01/05/19 0542  PainSc: 0-No pain      Patients Stated Pain Goal: 3 (01/05/19 0542)  Immediate Anesthesia Transfer of Care Note  Patient: Hunter Stone [facility]  Procedure(s) Performed: Procedure(s) (LRB): TRANSURETHRAL RESECTION OF THE PROSTATE (TURP) (N/A)  Patient Location: PACU  Anesthesia Type: General  Level of Consciousness: awake, alert  and oriented  Airway & Oxygen Therapy: Patient Spontanous Breathing and Patient connected to nasal cannula oxygen  Post-op Assessment: Report given to PACU RN and Post -op Vital signs reviewed and stable  Post vital signs: Reviewed and stable  Complications: No apparent anesthesia complications

## 2019-01-06 ENCOUNTER — Encounter (HOSPITAL_BASED_OUTPATIENT_CLINIC_OR_DEPARTMENT_OTHER): Payer: Self-pay | Admitting: Urology

## 2019-01-06 NOTE — Discharge Summary (Signed)
Physician Discharge Summary      Patient ID: Hunter Stone MRN: [id number] DOB/AGE: 68-21-1952 68 y.o.  Admit date: 01/05/2019 Discharge date: 01/06/2019  Admission Diagnoses: BENIGN PROSTATIC HYPERPLASIA  Discharge Diagnoses:  Active Problems:   BPH with urinary obstruction   Discharged Condition: good  Hospital Course: He was admitted for elective transurethral resection of his prostate which was undertaken on 10/23/79 without complication.  Postoperatively he was noted to be doing well with slightly pink urine on CBI and no pain.  He was observed overnight and the following morning his catheter was removed however he was only able to urinate a small amount and was having some suprapubic discomfort.  There were any clots in the urine that he voided so I do not think he is in clot retention.  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  He knows how to perform self catheterization and therefore will come to my office and pick up some coude tipped catheters to perform self catheterization.  I told him that I thought his bladder would eventually come around over time.  He is therefore felt to be ready for discharge at this time.  Discharge Exam: Blood pressure 130/80, pulse 72, temperature 98.2 F (36.8 C), resp. rate 16, height 5\' 6"  (1.676 m), weight 87.8 kg, SpO2 97 %. General: Awake, alert and in no apparent distress. Chest: Normal respiratory effort. Cardiovascular: Regular rate and rhythm. Abdomen: Soft, nontender, nondistended.   Disposition: Discharge disposition: 01-Home or Self Care       Discharge Instructions    Discharge patient   Complete by: As directed    Discharge disposition: 01-Home or Self Care   Discharge patient date: 01/06/2019     Allergies as of 01/06/2019   No Known Allergies     Medication List    TAKE these medications   aspirin 81 MG  tablet Take by mouth daily.   cetirizine 10 MG tablet Commonly known as: ZYRTEC Take 10 mg by mouth every evening.   finasteride 5 MG tablet Commonly known as: PROSCAR Take 5 mg by mouth every morning.   olmesartan-hydrochlorothiazide 20-12.5 MG tablet Commonly known as: BENICAR HCT Take 1 tablet by mouth every evening.   phenazopyridine 200 MG tablet Commonly known as: Pyridium Take 1 tablet (200 mg total) by mouth 3 (three) times daily as needed for pain.   rosuvastatin 10 MG tablet Commonly known as: CRESTOR Take 10 mg by mouth every evening.   tamsulosin 0.4 MG Caps capsule Commonly known as: FLOMAX Take 0.4 mg by mouth every evening.      Follow-up Information    [facility] On 01/12/2019.   Why: For your appiontment at 8:30 Contact information: [address]          Signed: Claybon Jabs 01/06/2019, 9:57 AM

## 2019-01-12 DIAGNOSIS — R310 Gross hematuria: Secondary | ICD-10-CM | POA: Diagnosis not present

## 2019-02-05 ENCOUNTER — Encounter (HOSPITAL_BASED_OUTPATIENT_CLINIC_OR_DEPARTMENT_OTHER): Payer: Self-pay | Admitting: Urology

## 2019-02-27 DIAGNOSIS — M1711 Unilateral primary osteoarthritis, right knee: Secondary | ICD-10-CM | POA: Diagnosis not present

## 2019-02-27 DIAGNOSIS — M2241 Chondromalacia patellae, right knee: Secondary | ICD-10-CM | POA: Diagnosis not present

## 2019-04-17 DIAGNOSIS — Z23 Encounter for immunization: Secondary | ICD-10-CM | POA: Diagnosis not present

## 2019-06-19 DIAGNOSIS — G4733 Obstructive sleep apnea (adult) (pediatric): Secondary | ICD-10-CM | POA: Diagnosis not present

## 2019-06-19 DIAGNOSIS — R7303 Prediabetes: Secondary | ICD-10-CM | POA: Diagnosis not present

## 2019-06-19 DIAGNOSIS — M6208 Separation of muscle (nontraumatic), other site: Secondary | ICD-10-CM | POA: Diagnosis not present

## 2019-06-19 DIAGNOSIS — Z0000 Encounter for general adult medical examination without abnormal findings: Secondary | ICD-10-CM | POA: Diagnosis not present

## 2019-06-19 DIAGNOSIS — N401 Enlarged prostate with lower urinary tract symptoms: Secondary | ICD-10-CM | POA: Diagnosis not present

## 2019-06-19 DIAGNOSIS — M25561 Pain in right knee: Secondary | ICD-10-CM | POA: Diagnosis not present

## 2019-06-19 DIAGNOSIS — E782 Mixed hyperlipidemia: Secondary | ICD-10-CM | POA: Diagnosis not present

## 2019-06-19 DIAGNOSIS — I10 Essential (primary) hypertension: Secondary | ICD-10-CM | POA: Diagnosis not present

## 2019-06-19 DIAGNOSIS — Z125 Encounter for screening for malignant neoplasm of prostate: Secondary | ICD-10-CM | POA: Diagnosis not present

## 2019-06-19 DIAGNOSIS — J309 Allergic rhinitis, unspecified: Secondary | ICD-10-CM | POA: Diagnosis not present

## 2019-06-19 DIAGNOSIS — Z23 Encounter for immunization: Secondary | ICD-10-CM | POA: Diagnosis not present

## 2019-06-19 DIAGNOSIS — R319 Hematuria, unspecified: Secondary | ICD-10-CM | POA: Diagnosis not present

## 2019-08-07 DIAGNOSIS — H43393 Other vitreous opacities, bilateral: Secondary | ICD-10-CM | POA: Diagnosis not present

## 2019-10-13 DIAGNOSIS — I10 Essential (primary) hypertension: Secondary | ICD-10-CM | POA: Diagnosis not present

## 2019-10-13 DIAGNOSIS — R7303 Prediabetes: Secondary | ICD-10-CM | POA: Diagnosis not present

## 2019-10-13 DIAGNOSIS — G4733 Obstructive sleep apnea (adult) (pediatric): Secondary | ICD-10-CM | POA: Diagnosis not present

## 2019-10-13 DIAGNOSIS — E782 Mixed hyperlipidemia: Secondary | ICD-10-CM | POA: Diagnosis not present

## 2019-10-13 DIAGNOSIS — Z6833 Body mass index (BMI) 33.0-33.9, adult: Secondary | ICD-10-CM | POA: Diagnosis not present

## 2019-10-13 DIAGNOSIS — R0600 Dyspnea, unspecified: Secondary | ICD-10-CM | POA: Diagnosis not present

## 2019-11-10 ENCOUNTER — Ambulatory Visit: Payer: Medicare Other | Admitting: Cardiology

## 2019-11-11 ENCOUNTER — Encounter: Payer: Self-pay | Admitting: Cardiology

## 2019-11-11 ENCOUNTER — Other Ambulatory Visit: Payer: Self-pay

## 2019-11-11 ENCOUNTER — Ambulatory Visit (INDEPENDENT_AMBULATORY_CARE_PROVIDER_SITE_OTHER): Payer: Medicare Other | Admitting: Cardiology

## 2019-11-11 VITALS — BP 120/76 | HR 83 | Ht 66.0 in | Wt 205.8 lb

## 2019-11-11 DIAGNOSIS — R0600 Dyspnea, unspecified: Secondary | ICD-10-CM

## 2019-11-11 DIAGNOSIS — R0789 Other chest pain: Secondary | ICD-10-CM

## 2019-11-11 DIAGNOSIS — I209 Angina pectoris, unspecified: Secondary | ICD-10-CM

## 2019-11-11 DIAGNOSIS — I208 Other forms of angina pectoris: Secondary | ICD-10-CM | POA: Diagnosis not present

## 2019-11-11 DIAGNOSIS — Z01812 Encounter for preprocedural laboratory examination: Secondary | ICD-10-CM

## 2019-11-11 DIAGNOSIS — R0609 Other forms of dyspnea: Secondary | ICD-10-CM

## 2019-11-11 MED ORDER — METOPROLOL TARTRATE 100 MG PO TABS: 100.0000 mg | ORAL_TABLET | Freq: Once | ORAL | 0 refills | Status: DC

## 2019-11-11 NOTE — Patient Instructions (Addendum)
Medication Instructions:  The current medical regimen is effective;  continue present plan and medications.  *If you need a refill on your cardiac medications before your next appointment, please call your pharmacy*  Testing/Procedures: Your physician has requested that you have an echocardiogram. Echocardiography is a painless test that uses sound waves to create images of your heart. It provides your doctor with information about the size and shape of your heart and how well your heart's chambers and valves are working. This procedure takes approximately one hour. There are no restrictions for this procedure.  Your physician has requested that you have Coronary CT. Cardiac computed tomography (CT) is a painless test that uses an x-ray machine to take clear, detailed pictures of your heart.   Follow-Up: Will be determined after the above testing.  At [facility], you and your health needs are our priority.  As part of our continuing mission to provide you with exceptional heart care, we have created designated Provider Care Teams.  These Care Teams include your primary Cardiologist (physician) and Advanced Practice Providers (APPs -  Physician Assistants and Nurse Practitioners) who all work together to provide you with the care you need, when you need it.  We recommend signing up for the patient portal called "MyChart".  Sign up information is provided on this After Visit Summary.  MyChart is used to connect with patients for Virtual Visits (Telemedicine).  Patients are able to view lab/test results, encounter notes, upcoming appointments, etc.  Non-urgent messages can be sent to your provider as well.   To learn more about what you can do with MyChart, go to [email or web address].    Thank you for choosing [facility]!!       Your cardiac CT will be scheduled at:   [facility] [address] [place], [place] [address] [phone]  Please arrive at the  [facility] main entrance of [facility] 30 minutes prior to test start time. Proceed to the [facility] Radiology Department (first floor) to check-in and test prep.  Please follow these instructions carefully (unless otherwise directed):  Hold all erectile dysfunction medications at least 3 days (72 hrs) prior to test.  On the Night Before the Test: . Be sure to Drink plenty of water. . Do not consume any caffeinated/decaffeinated beverages or chocolate 12 hours prior to your test. . Do not take any antihistamines 12 hours prior to your test. . If you take Metformin do not take 24 hours prior to test.  On the Day of the Test: . Drink plenty of water. Do not drink any water within one hour of the test. . Do not eat any food 4 hours prior to the test. . You may take your regular medications prior to the test.  . Take metoprolol (Lopressor) two hours prior to test. . HOLD Furosemide/Hydrochlorothiazide morning of the test.      After the Test: . Drink plenty of water. . After receiving IV contrast, you may experience a mild flushed feeling. This is normal. . On occasion, you may experience a mild rash up to 24 hours after the test. This is not dangerous. If this occurs, you can take Benadryl 25 mg and increase your fluid intake. . If you experience trouble breathing, this can be serious. If it is severe call 911 IMMEDIATELY. If it is mild, please call our office. . If you take any of these medications: Glipizide/Metformin, Avandament, Glucavance, please do not take 48 hours after completing  test unless otherwise instructed.   Once we have confirmed authorization from your insurance company, we will call you to set up a date and time for your test.   For non-scheduling related questions, please contact the cardiac imaging nurse navigator should you have any questions/concerns: Marchia Bond, RN Navigator Cardiac Imaging [facility] [phone]  office  For scheduling needs, including cancellations and rescheduling, please call [phone].

## 2019-11-11 NOTE — Progress Notes (Signed)
Cardiology Office Note:    Date:  11/11/2019   ID:  MONEY ETLING, DOB [id number], MRN [id number]  PCP:  Antony Contras, MD  Cardiologist:  Candee Furbish, MD  Electrophysiologist:  None   Referring MD: Antony Contras, MD     History of Present Illness:    Hunter Stone is a 69 y.o. male here for the evaluation of dyspnea on exertion at the request of Dr. Moreen Fowler.  In review of prior clinic note, he has been feeling dyspnea of exertion more frequent episodes over the past 6 months when exerting himself in a moderate fashion.  This is a change from years ago.  Able to rest and take deep breath and can resolve in 1 minute. Does not have any specific chest pain with exercise, but may get sharp left chest pain every now and then.  He has been taking a low-dose aspirin.  Had stress test 25 years OK.   Has 71 year old child.   Blood pressure has been under reasonably good control with Benicar HCT.  LDL was 95 on Crestor..  Hemoglobin A1c 5.8 previously.  Coronary artery disease runs in his family, grandfather CABG on both sides.  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  Non-smoker.  Previously Lipitor caused bilateral arm pain.  Retired Estate manager/land agent.   Past Medical History:  Diagnosis Date  . Allergic rhinitis   . Bladder stones   . BPH (benign prostatic hyperplasia)   . CTS (carpal tunnel syndrome) 09/01/2014   Right  . Depression   . Dyslipidemia   . Hearing loss   . History of anemia   . History of gross hematuria   . Hypertension   . Liver cyst 2017   Small cysts in the liver with a minimally complex cyst   . Neck mass    Left  . Obesity   . OSA (obstructive sleep apnea)    uses oral appliance  . Peptic ulcer disease   . Pre-diabetes   . Tinnitus   . Wears glasses     Past Surgical History:  Procedure Laterality Date  . ACHILLES TENDON REPAIR Left   . BICEPS TENDON REPAIR Right     . CARPAL TUNNEL RELEASE Right   . COLONOSCOPY    . CYSTOSCOPY WITH LITHOLAPAXY N/A 10/03/2018   Procedure: CYSTOSCOPY WITH LITHOLAPAXY/LEFT RETROGRADE;  Surgeon: Kathie Rhodes, MD;  Location: [facility];  Service: Urology;  Laterality: N/A;  . DENTAL SURGERY    . DENTAL SURGERY  12/2018   TOOTH PULLED  . HOLMIUM LASER APPLICATION  [id number]   Procedure: HOLMIUM LASER APPLICATION LITHIOPAXY;  Surgeon: Kathie Rhodes, MD;  Location: [facility];  Service: Urology;;  . TONSILLECTOMY    . TRANSURETHRAL RESECTION OF PROSTATE N/A 01/05/2019   Procedure: TRANSURETHRAL RESECTION OF THE PROSTATE (TURP);  Surgeon: Kathie Rhodes, MD;  Location: [facility];  Service: Urology;  Laterality: N/A;  . UPPER GI ENDOSCOPY      Current Medications: Current Meds  Medication Sig  . aspirin 81 MG tablet Take by mouth daily.  . cetirizine (ZYRTEC) 10 MG tablet Take 10 mg by mouth every evening.   . olmesartan-hydrochlorothiazide (BENICAR HCT) 20-12.5 MG per tablet Take 1 tablet by mouth every evening.   . rosuvastatin (CRESTOR) 10 MG tablet Take 10 mg by mouth every evening.      Allergies:   Patient has no  known allergies.   Social History   Socioeconomic History  . Marital status: Married    Spouse name: Not on file  . Number of children: 3  . Years of education: Not on file  . Highest education level: Not on file  Occupational History    Employer: [employer]  Tobacco Use  . Smoking status: Never Smoker  . Smokeless tobacco: Never Used  Substance and Sexual Activity  . Alcohol use: Yes    Comment: 2 drinks per night (wine, liquor, beer)  . Drug use: No  . Sexual activity: Not on file  Other Topics Concern  . Not on file  Social History Narrative   He is a retired Estate manager/land agent.   Lives with wife and son.         Social Determinants of Health   Financial Resource Strain:   . Difficulty of Paying Living Expenses:   Food  Insecurity:   . Worried About Charity fundraiser in the Last Year:   . Arboriculturist in the Last Year:   Transportation Needs:   . Film/video editor (Medical):   Marland Kitchen Lack of Transportation (Non-Medical):   Physical Activity:   . Days of Exercise per Week:   . Minutes of Exercise per Session:   Stress:   . Feeling of Stress :   Social Connections:   . Frequency of Communication with Friends and Family:   . Frequency of Social Gatherings with Friends and Family:   . Attends Religious Services:   . Active Member of Clubs or Organizations:   . Attends Archivist Meetings:   Marland Kitchen Marital Status:      Family History: The patient's family history includes Coronary artery disease in his maternal grandfather and paternal grandfather; Healthy in his brother; Heart attack in his maternal grandfather and paternal grandfather; Huntington's disease in his father; Lung cancer in his mother.  ROS:   Please see the history of present illness.    Denies any fevers chills nausea vomiting syncope bleeding all other systems reviewed and are negative.  EKGs/Labs/Other Studies Reviewed:    The following studies were reviewed today: Prior office notes reviewed, lab work, EKG interpretation.  EKG:  EKG is  ordered today.  The ekg ordered today demonstrates sinus rhythm 83 with no other abnormalities.,  Isolated Q wave in lead III.  Recent Labs: 01/05/2019: BUN 23; Creatinine, Ser 1.10; Hemoglobin 13.9; Potassium 3.6; Sodium 140  Recent Lipid Panel No results found for: CHOL, TRIG, HDL, CHOLHDL, VLDL, LDLCALC, LDLDIRECT  Physical Exam:    VS:  BP 120/76   Pulse 83   Ht 5\' 6"  (1.676 m)   Wt 205 lb 12.8 oz (93.4 kg)   SpO2 96%   BMI 33.22 kg/m     Wt Readings from Last 3 Encounters:  11/11/19 205 lb 12.8 oz (93.4 kg)  01/05/19 193 lb 9 oz (87.8 kg)  10/03/18 192 lb 1.6 oz (87.1 kg)     GEN:  Well nourished, well developed in no acute distress, overweight HEENT: Normal NECK:  No JVD; No carotid bruits LYMPHATICS: No lymphadenopathy CARDIAC: RRR, no murmurs, rubs, gallops RESPIRATORY:  Clear to auscultation without rales, wheezing or rhonchi  ABDOMEN: Soft, non-tender, non-distended MUSCULOSKELETAL:  No edema; No deformity  SKIN: Warm and dry NEUROLOGIC:  Alert and oriented x 3 PSYCHIATRIC:  Normal affect   ASSESSMENT:    1. Angina pectoris ([place])   2. DOE (dyspnea on exertion)  3. Atypical chest pain   4. Other forms of angina pectoris ([place])   5. Pre-procedure lab exam    PLAN:    In order of problems listed above:  Dyspnea on exertion which may be anginal equivalent -EKG done previously was reportedly normal sinus rhythm with isolated Q wave in lead III.  Early repolarization was noted in V2, borderline EKG.  I do not have a current copy of this EKG. -Given that his symptoms may be his manifestation of angina, we should go ahead and proceed with coronary CT FFR analysis. -Prior creatinine 1.1, hemoglobin 13.9 -We will check an echocardiogram as well to ensure that he does not have any evidence of cardiomyopathy.  Essential hypertension -Currently well controlled with Benicar HCT.  Mixed hyperlipidemia -Continue with Crestor 10 mg for now.  Could consider increasing this if there is evidence of atherosclerosis on CT.  Obstructive sleep apnea -Continuing with oral device.  Dr. Toy Cookey.    Medication Adjustments/Labs and Tests Ordered: Current medicines are reviewed at length with the patient today.  Concerns regarding medicines are outlined above.  Orders Placed This Encounter  Procedures  . CT CORONARY MORPH W/CTA COR W/SCORE W/CA W/CM &/OR WO/CM  . CT CORONARY FRACTIONAL FLOW RESERVE DATA PREP  . CT CORONARY FRACTIONAL FLOW RESERVE FLUID ANALYSIS  . Basic metabolic panel  . EKG 12-Lead  . ECHOCARDIOGRAM COMPLETE   Meds ordered this encounter  Medications  . metoprolol tartrate (LOPRESSOR) 100 MG tablet    Sig: Take 1 tablet (100 mg  total) by mouth once for 1 dose. Take one tablet 2 hours before your CT scan    Dispense:  1 tablet    Refill:  0    Patient Instructions  Medication Instructions:  The current medical regimen is effective;  continue present plan and medications.  *If you need a refill on your cardiac medications before your next appointment, please call your pharmacy*  Testing/Procedures: Your physician has requested that you have an echocardiogram. Echocardiography is a painless test that uses sound waves to create images of your heart. It provides your doctor with information about the size and shape of your heart and how well your heart's chambers and valves are working. This procedure takes approximately one hour. There are no restrictions for this procedure.  Your physician has requested that you have Coronary CT. Cardiac computed tomography (CT) is a painless test that uses an x-ray machine to take clear, detailed pictures of your heart.   Follow-Up: Will be determined after the above testing.  At [facility], you and your health needs are our priority.  As part of our continuing mission to provide you with exceptional heart care, we have created designated Provider Care Teams.  These Care Teams include your primary Cardiologist (physician) and Advanced Practice Providers (APPs -  Physician Assistants and Nurse Practitioners) who all work together to provide you with the care you need, when you need it.  We recommend signing up for the patient portal called "MyChart".  Sign up information is provided on this After Visit Summary.  MyChart is used to connect with patients for Virtual Visits (Telemedicine).  Patients are able to view lab/test results, encounter notes, upcoming appointments, etc.  Non-urgent messages can be sent to your provider as well.   To learn more about what you can do with MyChart, go to [email or web address].    Thank you for choosing [facility]!!       Your  cardiac CT will  be scheduled at:   [facility] [address] [place], [place] [address] [phone]  Please arrive at the [facility] main entrance of [facility] 30 minutes prior to test start time. Proceed to the [facility] Radiology Department (first floor) to check-in and test prep.  Please follow these instructions carefully (unless otherwise directed):  Hold all erectile dysfunction medications at least 3 days (72 hrs) prior to test.  On the Night Before the Test: . Be sure to Drink plenty of water. . Do not consume any caffeinated/decaffeinated beverages or chocolate 12 hours prior to your test. . Do not take any antihistamines 12 hours prior to your test. . If you take Metformin do not take 24 hours prior to test.  On the Day of the Test: . Drink plenty of water. Do not drink any water within one hour of the test. . Do not eat any food 4 hours prior to the test. . You may take your regular medications prior to the test.  . Take metoprolol (Lopressor) two hours prior to test. . HOLD Furosemide/Hydrochlorothiazide morning of the test.      After the Test: . Drink plenty of water. . After receiving IV contrast, you may experience a mild flushed feeling. This is normal. . On occasion, you may experience a mild rash up to 24 hours after the test. This is not dangerous. If this occurs, you can take Benadryl 25 mg and increase your fluid intake. . If you experience trouble breathing, this can be serious. If it is severe call 911 IMMEDIATELY. If it is mild, please call our office. . If you take any of these medications: Glipizide/Metformin, Avandament, Glucavance, please do not take 48 hours after completing test unless otherwise instructed.   Once we have confirmed authorization from your insurance company, we will call you to set up a date and time for your test.   For non-scheduling related questions, please contact the cardiac imaging nurse navigator  should you have any questions/concerns: Marchia Bond, RN Navigator Cardiac Imaging [facility] [phone] office  For scheduling needs, including cancellations and rescheduling, please call [phone].       Signed, Candee Furbish, MD  11/11/2019 10:47 AM    [place]

## 2019-11-24 ENCOUNTER — Other Ambulatory Visit: Payer: Self-pay

## 2019-11-24 ENCOUNTER — Ambulatory Visit (HOSPITAL_COMMUNITY): Payer: Medicare Other | Attending: Cardiovascular Disease

## 2019-11-24 DIAGNOSIS — R0600 Dyspnea, unspecified: Secondary | ICD-10-CM | POA: Diagnosis not present

## 2019-11-24 DIAGNOSIS — I209 Angina pectoris, unspecified: Secondary | ICD-10-CM

## 2019-11-24 DIAGNOSIS — R0789 Other chest pain: Secondary | ICD-10-CM | POA: Diagnosis not present

## 2019-11-24 DIAGNOSIS — R0609 Other forms of dyspnea: Secondary | ICD-10-CM

## 2019-12-03 ENCOUNTER — Other Ambulatory Visit: Payer: Medicare Other | Admitting: *Deleted

## 2019-12-03 ENCOUNTER — Other Ambulatory Visit: Payer: Self-pay

## 2019-12-03 DIAGNOSIS — I209 Angina pectoris, unspecified: Secondary | ICD-10-CM | POA: Diagnosis not present

## 2019-12-03 DIAGNOSIS — R0600 Dyspnea, unspecified: Secondary | ICD-10-CM | POA: Diagnosis not present

## 2019-12-03 DIAGNOSIS — Z01812 Encounter for preprocedural laboratory examination: Secondary | ICD-10-CM

## 2019-12-03 DIAGNOSIS — R0609 Other forms of dyspnea: Secondary | ICD-10-CM

## 2019-12-03 LAB — BASIC METABOLIC PANEL
BUN/Creatinine Ratio: 11 (ref 10–24)
BUN: 14 mg/dL (ref 8–27)
CO2: 23 mmol/L (ref 20–29)
Calcium: 10.7 mg/dL — ABNORMAL HIGH (ref 8.6–10.2)
Chloride: 99 mmol/L (ref 96–106)
Creatinine, Ser: 1.24 mg/dL (ref 0.76–1.27)
GFR calc Af Amer: 69 mL/min/{1.73_m2} (ref >59)
GFR calc non Af Amer: 59 mL/min/{1.73_m2} — ABNORMAL LOW (ref >59)
Glucose: 116 mg/dL — ABNORMAL HIGH (ref 65–99)
Potassium: 4 mmol/L (ref 3.5–5.2)
Sodium: 138 mmol/L (ref 134–144)

## 2019-12-10 ENCOUNTER — Telehealth (HOSPITAL_COMMUNITY): Payer: Self-pay | Admitting: *Deleted

## 2019-12-10 NOTE — Telephone Encounter (Signed)

## 2019-12-11 ENCOUNTER — Ambulatory Visit (HOSPITAL_COMMUNITY)
Admission: RE | Admit: 2019-12-11 | Discharge: 2019-12-11 | Disposition: A | Discharge status: Home / Self Care | Payer: Medicare Other | Source: Ambulatory Visit | Attending: Cardiology | Admitting: Cardiology

## 2019-12-11 ENCOUNTER — Other Ambulatory Visit: Payer: Self-pay

## 2019-12-11 DIAGNOSIS — I209 Angina pectoris, unspecified: Secondary | ICD-10-CM | POA: Insufficient documentation

## 2019-12-11 DIAGNOSIS — R0600 Dyspnea, unspecified: Secondary | ICD-10-CM | POA: Insufficient documentation

## 2019-12-11 DIAGNOSIS — R0609 Other forms of dyspnea: Secondary | ICD-10-CM

## 2019-12-11 DIAGNOSIS — R0789 Other chest pain: Secondary | ICD-10-CM | POA: Diagnosis not present

## 2019-12-11 MED ORDER — NITROGLYCERIN 0.4 MG SL SUBL
SUBLINGUAL_TABLET | SUBLINGUAL | Status: AC
  Administered 2019-12-11: 0.8 mg via SUBLINGUAL
  Filled 2019-12-11: qty 2

## 2019-12-11 MED ORDER — NITROGLYCERIN 0.4 MG SL SUBL: 0.8000 mg | SUBLINGUAL_TABLET | SUBLINGUAL | Status: DC | PRN

## 2019-12-11 MED ORDER — IOHEXOL 350 MG/ML SOLN
100.0000 mL | Freq: Once | INTRAVENOUS | Status: AC | PRN
  Administered 2019-12-11: 100 mL via INTRAVENOUS

## 2019-12-29 DIAGNOSIS — M1711 Unilateral primary osteoarthritis, right knee: Secondary | ICD-10-CM | POA: Diagnosis not present

## 2019-12-29 DIAGNOSIS — M2241 Chondromalacia patellae, right knee: Secondary | ICD-10-CM | POA: Diagnosis not present

## 2020-03-29 DIAGNOSIS — Z23 Encounter for immunization: Secondary | ICD-10-CM | POA: Diagnosis not present

## 2020-04-06 DIAGNOSIS — M2241 Chondromalacia patellae, right knee: Secondary | ICD-10-CM | POA: Diagnosis not present

## 2020-04-06 DIAGNOSIS — M1711 Unilateral primary osteoarthritis, right knee: Secondary | ICD-10-CM | POA: Diagnosis not present

## 2020-04-12 DIAGNOSIS — Z23 Encounter for immunization: Secondary | ICD-10-CM | POA: Diagnosis not present

## 2020-04-19 DIAGNOSIS — M25531 Pain in right wrist: Secondary | ICD-10-CM | POA: Diagnosis not present

## 2020-04-19 DIAGNOSIS — M25532 Pain in left wrist: Secondary | ICD-10-CM | POA: Diagnosis not present

## 2020-05-05 DIAGNOSIS — M2550 Pain in unspecified joint: Secondary | ICD-10-CM | POA: Diagnosis not present

## 2020-05-06 DIAGNOSIS — M25532 Pain in left wrist: Secondary | ICD-10-CM | POA: Diagnosis not present

## 2020-05-06 DIAGNOSIS — M25531 Pain in right wrist: Secondary | ICD-10-CM | POA: Diagnosis not present

## 2020-05-12 DIAGNOSIS — M25531 Pain in right wrist: Secondary | ICD-10-CM | POA: Diagnosis not present

## 2020-05-12 DIAGNOSIS — M25532 Pain in left wrist: Secondary | ICD-10-CM | POA: Diagnosis not present

## 2020-05-12 DIAGNOSIS — M654 Radial styloid tenosynovitis [de Quervain]: Secondary | ICD-10-CM | POA: Diagnosis not present

## 2020-05-19 DIAGNOSIS — I10 Essential (primary) hypertension: Secondary | ICD-10-CM | POA: Diagnosis not present

## 2020-05-19 DIAGNOSIS — E782 Mixed hyperlipidemia: Secondary | ICD-10-CM | POA: Diagnosis not present

## 2020-05-19 DIAGNOSIS — M2550 Pain in unspecified joint: Secondary | ICD-10-CM | POA: Diagnosis not present

## 2020-05-20 DIAGNOSIS — M25561 Pain in right knee: Secondary | ICD-10-CM | POA: Diagnosis not present

## 2020-05-20 DIAGNOSIS — M1711 Unilateral primary osteoarthritis, right knee: Secondary | ICD-10-CM | POA: Diagnosis not present

## 2020-05-31 DIAGNOSIS — M19012 Primary osteoarthritis, left shoulder: Secondary | ICD-10-CM | POA: Diagnosis not present

## 2020-06-14 DIAGNOSIS — M654 Radial styloid tenosynovitis [de Quervain]: Secondary | ICD-10-CM | POA: Diagnosis not present

## 2020-06-14 DIAGNOSIS — M25532 Pain in left wrist: Secondary | ICD-10-CM | POA: Diagnosis not present

## 2020-06-14 DIAGNOSIS — M25531 Pain in right wrist: Secondary | ICD-10-CM | POA: Diagnosis not present

## 2020-06-20 NOTE — Patient Instructions (Addendum)
DUE TO COVID-19 ONLY ONE VISITOR IS ALLOWED TO COME WITH YOU AND STAY IN THE WAITING ROOM ONLY DURING PRE OP AND PROCEDURE.   IF YOU WILL BE ADMITTED INTO THE HOSPITAL YOU ARE ALLOWED ONE SUPPORT PERSON DURING VISITATION HOURS ONLY (10AM -8PM)   . The support person may change daily. . The support person must pass our screening, gel in and out, and wear a mask at all times, including in the patient's room. . Patients must also wear a mask when staff or their support person are in the room.   COVID SWAB TESTING MUST BE COMPLETED ON:  Friday, 06-24-20 @ 8:20 AM   [address] [place], [place] [address]  (Must self quarantine after testing. Follow instructions on handout.)        Your procedure is scheduled on:  Tuesday, 06-28-20   Report to [facility] Main  Entrance   Report to admitting at 7:30 AM   Call this number if you have problems the morning of surgery [phone]   Do not eat food :After Midnight.   May have liquids until 7:00 AM day of surgery  CLEAR LIQUID DIET  Foods Allowed                                                                     Foods Excluded  Water, Black Coffee and tea, regular and decaf            liquids that you cannot  Plain Jell-O in any flavor  (No red)                                  see through such as: Fruit ices (not with fruit pulp)                                      milk, soups, orange juice              Iced Popsicles (No red)                                      All solid food                                   Apple juices Sports drinks like Gatorade (No red) Lightly seasoned clear broth or consume(fat free) Sugar, honey syrup     Complete one Ensure drink the morning of surgery at  7:00 AM  the day of surgery.     Oral Hygiene is also important to reduce your risk of infection.                                    Remember - BRUSH YOUR TEETH THE MORNING OF SURGERY WITH YOUR REGULAR TOOTHPASTE   Do NOT smoke after  Midnight   Take these medicines the morning of surgery  with A SIP OF WATER: None                              You may not have any metal on your body including jewelry, and body piercings             Do not wear lotions, powders, perfumes/cologne, or deodorant             Men may shave face and neck.   Do not bring valuables to the hospital. [place].   Contacts, dentures or bridgework may not be worn into surgery.   Bring small overnight bag day of surgery.      Special Instructions: Bring a copy of your healthcare power of attorney and living will documents         the day of surgery if you haven't scanned them in before.              Please read over the following fact sheets you were given: IF YOU HAVE QUESTIONS ABOUT YOUR PRE OP INSTRUCTIONS PLEASE CALL [phone]   [place] - Preparing for Surgery Before surgery, you can play an important role.  Because skin is not sterile, your skin needs to be as free of germs as possible.  You can reduce the number of germs on your skin by washing with CHG (chlorahexidine gluconate) soap before surgery.  CHG is an antiseptic cleaner which kills germs and bonds with the skin to continue killing germs even after washing. Please DO NOT use if you have an allergy to CHG or antibacterial soaps.  If your skin becomes reddened/irritated stop using the CHG and inform your nurse when you arrive at Short Stay. Do not shave (including legs and underarms) for at least 48 hours prior to the first CHG shower.  You may shave your face/neck.  Please follow these instructions carefully:  1.  Shower with CHG Soap the night before surgery and the  morning of surgery.  2.  If you choose to wash your hair, wash your hair first as usual with your normal  shampoo.  3.  After you shampoo, rinse your hair and body thoroughly to remove the shampoo.                             4.  Use CHG as you would any other liquid soap.  You  can apply chg directly to the skin and wash.  Gently with a scrungie or clean washcloth.  5.  Apply the CHG Soap to your body ONLY FROM THE NECK DOWN.   Do   not use on face/ open                           Wound or open sores. Avoid contact with eyes, ears mouth and   genitals (private parts).                       Wash face,  Genitals (private parts) with your normal soap.             6.  Wash thoroughly, paying special attention to the area where your    surgery  will be performed.  7.  Thoroughly rinse your body with warm water from the neck down.  8.  DO  NOT shower/wash with your normal soap after using and rinsing off the CHG Soap.                9.  Pat yourself dry with a clean towel.            10.  Wear clean pajamas.            11.  Place clean sheets on your bed the night of your first shower and do not  sleep with pets. Day of Surgery : Do not apply any lotions/deodorants the morning of surgery.  Please wear clean clothes to the hospital/surgery center.  FAILURE TO FOLLOW THESE INSTRUCTIONS MAY RESULT IN THE CANCELLATION OF YOUR SURGERY  PATIENT SIGNATURE_________________________________  NURSE SIGNATURE__________________________________  ________________________________________________________________________   Hunter Stone  An incentive spirometer is a tool that can help keep your lungs clear and active. This tool measures how well you are filling your lungs with each breath. Taking long deep breaths may help reverse or decrease the chance of developing breathing (pulmonary) problems (especially infection) following:  A long period of time when you are unable to move or be active. BEFORE THE PROCEDURE   If the spirometer includes an indicator to show your best effort, your nurse or respiratory therapist will set it to a desired goal.  If possible, sit up straight or lean slightly forward. Try not to slouch.  Hold the incentive spirometer in an upright  position. INSTRUCTIONS FOR USE  1. Sit on the edge of your bed if possible, or sit up as far as you can in bed or on a chair. 2. Hold the incentive spirometer in an upright position. 3. Breathe out normally. 4. Place the mouthpiece in your mouth and seal your lips tightly around it. 5. Breathe in slowly and as deeply as possible, raising the piston or the ball toward the top of the column. 6. Hold your breath for 3-5 seconds or for as long as possible. Allow the piston or ball to fall to the bottom of the column. 7. Remove the mouthpiece from your mouth and breathe out normally. 8. Rest for a few seconds and repeat Steps 1 through 7 at least 10 times every 1-2 hours when you are awake. Take your time and take a few normal breaths between deep breaths. 9. The spirometer may include an indicator to show your best effort. Use the indicator as a goal to work toward during each repetition. 10. After each set of 10 deep breaths, practice coughing to be sure your lungs are clear. If you have an incision (the cut made at the time of surgery), support your incision when coughing by placing a pillow or rolled up towels firmly against it. Once you are able to get out of bed, walk around indoors and cough well. You may stop using the incentive spirometer when instructed by your caregiver.  RISKS AND COMPLICATIONS  Take your time so you do not get dizzy or light-headed.  If you are in pain, you may need to take or ask for pain medication before doing incentive spirometry. It is harder to take a deep breath if you are having pain. AFTER USE  Rest and breathe slowly and easily.  It can be helpful to keep track of a log of your progress. Your caregiver can provide you with a simple table to help with this. If you are using the spirometer at home, follow these instructions: [place] IF:   You are having difficultly  using the spirometer.  You have trouble using the spirometer as often as  instructed.  Your pain medication is not giving enough relief while using the spirometer.  You develop fever of 100.5 F (38.1 C) or higher. SEEK IMMEDIATE MEDICAL CARE IF:   You cough up bloody sputum that had not been present before.  You develop fever of 102 F (38.9 C) or greater.  You develop worsening pain at or near the incision site. MAKE SURE YOU:   Understand these instructions.  Will watch your condition.  Will get help right away if you are not doing well or get worse. Document Released: 11/12/2006 Document Revised: 09/24/2011 Document Reviewed: 01/13/2007 Exit[employer] Patient Information 2014 [employer], [place].   ________________________________________________________________________  WHAT IS A BLOOD TRANSFUSION? Blood Transfusion Information  A transfusion is the replacement of blood or some of its parts. Blood is made up of multiple cells which provide different functions.  Red blood cells carry oxygen and are used for blood loss replacement.  White blood cells fight against infection.  Platelets control bleeding.  Plasma helps clot blood.  Other blood products are available for specialized needs, such as hemophilia or other clotting disorders. BEFORE THE TRANSFUSION  Who gives blood for transfusions?   Healthy volunteers who are fully evaluated to make sure their blood is safe. This is blood bank blood. Transfusion therapy is the safest it has ever been in the practice of medicine. Before blood is taken from a donor, a complete history is taken to make sure that person has no history of diseases nor engages in risky social behavior (examples are intravenous drug use or sexual activity with multiple partners). The donor's travel history is screened to minimize risk of transmitting infections, such as malaria. The donated blood is tested for signs of infectious diseases, such as HIV and hepatitis. The blood is then tested to be sure it is compatible with you in  order to minimize the chance of a transfusion reaction. If you or a relative donates blood, this is often done in anticipation of surgery and is not appropriate for emergency situations. It takes many days to process the donated blood. RISKS AND COMPLICATIONS Although transfusion therapy is very safe and saves many lives, the main dangers of transfusion include:   Getting an infectious disease.  Developing a transfusion reaction. This is an allergic reaction to something in the blood you were given. Every precaution is taken to prevent this. The decision to have a blood transfusion has been considered carefully by your caregiver before blood is given. Blood is not given unless the benefits outweigh the risks. AFTER THE TRANSFUSION  Right after receiving a blood transfusion, you will usually feel much better and more energetic. This is especially true if your red blood cells have gotten low (anemic). The transfusion raises the level of the red blood cells which carry oxygen, and this usually causes an energy increase.  The nurse administering the transfusion will monitor you carefully for complications. HOME CARE INSTRUCTIONS  No special instructions are needed after a transfusion. You may find your energy is better. Speak with your caregiver about any limitations on activity for underlying diseases you may have. SEEK MEDICAL CARE IF:   Your condition is not improving after your transfusion.  You develop redness or irritation at the intravenous (IV) site. SEEK IMMEDIATE MEDICAL CARE IF:  Any of the following symptoms occur over the next 12 hours:  Shaking chills.  You have a temperature by mouth above 102  F (38.9 C), not controlled by medicine.  Chest, back, or muscle pain.  People around you feel you are not acting correctly or are confused.  Shortness of breath or difficulty breathing.  Dizziness and fainting.  You get a rash or develop hives.  You have a decrease in urine  output.  Your urine turns a dark color or changes to pink, red, or brown. Any of the following symptoms occur over the next 10 days:  You have a temperature by mouth above 102 F (38.9 C), not controlled by medicine.  Shortness of breath.  Weakness after normal activity.  The white part of the eye turns yellow (jaundice).  You have a decrease in the amount of urine or are urinating less often.  Your urine turns a dark color or changes to pink, red, or brown. Document Released: 06/29/2000 Document Revised: 09/24/2011 Document Reviewed: 02/16/2008 [facility] Patient Information 2014 [place], [place].  _______________________________________________________________________

## 2020-06-20 NOTE — Progress Notes (Addendum)
COVID Vaccine Completed:  x3 Date COVID Vaccine completed: 08-05-19 and 08-26-19 and Booster 04-12-20 COVID vaccine manufacturer: [place]   PCP - Antony Contras, MD Cardiologist - Candee Furbish, MD  Chest x-ray -  EKG - 10-22-19 in Epic Stress Test -  ECHO - 11-24-19 in Epic Cardiac Cath -  Pacemaker/ICD device last checked: Coronary CT - 12-11-19 in Epic  Sleep Study - 10 years ago CPAP - No, wears a dental appliance  Fasting Blood Sugar -  Checks Blood Sugar _____ times a day  Blood Thinner Instructions: Aspirin Instructions:  ASA 81 mg  Last Dose:06-21-20  Anesthesia review:  Dyspnea on exertion with occasional chest pain.  HTN, OSA  Patient denies shortness of breath, fever, cough and chest pain at PAT appointment. Patient able to climb a flight of stairs and perform ADLs without assistance.   Patient verbalized understanding of instructions that were given to them at the PAT appointment. Patient was also instructed that they will need to review over the PAT instructions again at home before surgery.

## 2020-06-21 ENCOUNTER — Encounter (HOSPITAL_COMMUNITY)
Admission: RE | Admit: 2020-06-21 | Discharge: 2020-06-21 | Disposition: A | Discharge status: Home / Self Care | Payer: Medicare Other | Source: Ambulatory Visit | Attending: Orthopedic Surgery | Admitting: Orthopedic Surgery

## 2020-06-21 ENCOUNTER — Other Ambulatory Visit: Payer: Self-pay

## 2020-06-21 ENCOUNTER — Encounter (HOSPITAL_COMMUNITY): Payer: Self-pay

## 2020-06-21 DIAGNOSIS — Z01812 Encounter for preprocedural laboratory examination: Secondary | ICD-10-CM | POA: Insufficient documentation

## 2020-06-21 DIAGNOSIS — Z20822 Contact with and (suspected) exposure to covid-19: Secondary | ICD-10-CM | POA: Insufficient documentation

## 2020-06-21 HISTORY — DX: Unspecified osteoarthritis, unspecified site: M19.90

## 2020-06-21 HISTORY — DX: Anemia, unspecified: D64.9

## 2020-06-21 LAB — CBC
HCT: 43.3 % (ref 39.0–52.0)
Hemoglobin: 14.3 g/dL (ref 13.0–17.0)
MCH: 31 pg (ref 26.0–34.0)
MCHC: 33 g/dL (ref 30.0–36.0)
MCV: 93.7 fL (ref 80.0–100.0)
Platelets: 272 10*3/uL (ref 150–400)
RBC: 4.62 MIL/uL (ref 4.22–5.81)
RDW: 13.5 % (ref 11.5–15.5)
WBC: 5.8 10*3/uL (ref 4.0–10.5)
nRBC: 0 % (ref 0.0–0.2)

## 2020-06-21 LAB — BASIC METABOLIC PANEL
Anion gap: 9 (ref 5–15)
BUN: 24 mg/dL — ABNORMAL HIGH (ref 8–23)
CO2: 27 mmol/L (ref 22–32)
Calcium: 10.2 mg/dL (ref 8.9–10.3)
Chloride: 102 mmol/L (ref 98–111)
Creatinine, Ser: 1.31 mg/dL — ABNORMAL HIGH (ref 0.61–1.24)
GFR, Estimated: 59 mL/min — ABNORMAL LOW (ref >60)
Glucose, Bld: 111 mg/dL — ABNORMAL HIGH (ref 70–99)
Potassium: 4.5 mmol/L (ref 3.5–5.1)
Sodium: 138 mmol/L (ref 135–145)

## 2020-06-21 LAB — HEMOGLOBIN A1C
Hgb A1c MFr Bld: 5.7 % — ABNORMAL HIGH (ref 4.8–5.6)
Mean Plasma Glucose: 116.89 mg/dL

## 2020-06-21 LAB — SURGICAL PCR SCREEN
MRSA, PCR: NEGATIVE
Staphylococcus aureus: POSITIVE — AB

## 2020-06-21 NOTE — Progress Notes (Signed)
PCR results sent to Dr. Olin to review.   

## 2020-06-23 NOTE — H&P (Signed)
TOTAL KNEE ADMISSION H&P  Patient is being admitted for right total knee arthroplasty.  Subjective:  Chief Complaint:  Right knee OA / pain  HPI: Hunter Stone, 69 y.o. male, has a history of pain and functional disability in the right knee due to arthritis and has failed non-surgical conservative treatments for greater than 12 weeks to include NSAID's and/or analgesics, corticosteriod injections and activity modification.  Onset of symptoms was gradual, starting < 1 year ago with gradually worsening course since that time. The patient noted no past surgery on the right knee(s).  Patient currently rates pain in the right knee(s) at 7 out of 10 with activity. Patient has night pain, worsening of pain with activity and weight bearing, pain that interferes with activities of daily living, pain with passive range of motion, crepitus and joint swelling.  Patient has evidence of periarticular osteophytes and joint space narrowing by imaging studies. There is no active infection.  Risks, benefits and expectations were discussed with the patient.  Risks including but not limited to the risk of anesthesia, blood clots, nerve damage, blood vessel damage, failure of the prosthesis, infection and up to and including death.  Patient understand the risks, benefits and expectations and wishes to proceed with surgery.   D/C Plans:       Home   Post-op Meds:       No Rx given   Tranexamic Acid:      To be given - IV   Decadron:      Is to be given  FYI:      ASA  [place]  DME:   Pt equipment ARRANGED  PT:   OPPT @ [facility]  Pharmacy: [facility]    Patient Active Problem List   Diagnosis Date Noted  . 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 CTS (carpal tunnel syndrome) 09/01/2014   Past Medical History:  Diagnosis Date  . Allergic rhinitis   . Anemia   . Arthritis   . Bladder stones   . BPH (benign prostatic hyperplasia)   . CTS (carpal tunnel syndrome) 09/01/2014   Right  .  Depression   . Dyslipidemia   . Hearing loss   . History of anemia   . History of gross hematuria   . Hypertension   . Liver cyst 2017   Small cysts in the liver with a minimally complex cyst   . Neck mass    Left  . Obesity   . OSA (obstructive sleep apnea)    uses oral appliance  . Peptic ulcer disease   . Pre-diabetes   . Tinnitus   . Wears glasses     Past Surgical History:  Procedure Laterality Date  . ACHILLES TENDON REPAIR Left   . BICEPS TENDON REPAIR Right   . CARPAL TUNNEL RELEASE Right   . COLONOSCOPY    . CYSTOSCOPY WITH LITHOLAPAXY N/A 10/03/2018   Procedure: CYSTOSCOPY WITH LITHOLAPAXY/LEFT RETROGRADE;  Surgeon: Kathie Rhodes, MD;  Location: [facility];  Service: Urology;  Laterality: N/A;  . DENTAL SURGERY    . DENTAL SURGERY  12/2018   TOOTH PULLED  . HOLMIUM LASER APPLICATION  10/15/270   Procedure: HOLMIUM LASER APPLICATION LITHIOPAXY;  Surgeon: Kathie Rhodes, MD;  Location: [facility];  Service: Urology;;  . LIPOMA EXCISION    . TONSILLECTOMY    . TRANSURETHRAL RESECTION OF PROSTATE N/A 01/05/2019   Procedure: TRANSURETHRAL RESECTION OF THE PROSTATE (TURP);  Surgeon: Kathie Rhodes, MD;  Location: [facility];  Service: Urology;  Laterality: N/A;  . UPPER GI ENDOSCOPY      No current facility-administered medications for this encounter.   Current Outpatient Medications  Medication Sig Dispense Refill Last Dose  . aspirin 81 MG tablet Take 81 mg by mouth daily.      . cetirizine (ZYRTEC) 10 MG tablet Take 10 mg by mouth every evening.      Marland Kitchen ibuprofen (ADVIL) 200 MG tablet Take 200 mg by mouth every 6 (six) hours as needed for moderate pain.     Marland Kitchen olmesartan-hydrochlorothiazide (BENICAR HCT) 20-12.5 MG per tablet Take 1 tablet by mouth every evening.      . traMADol (ULTRAM) 50 MG tablet Take 50 mg by mouth 3 (three) times daily as needed for severe pain.      . rosuvastatin (CRESTOR) 10 MG tablet Take 10 mg  by mouth every evening.       No Known Allergies  Social History   Tobacco Use  . Smoking status: Never Smoker  . Smokeless tobacco: Never Used  Substance Use Topics  . Alcohol use: Yes    Comment: 1 per night    Family History  Problem Relation Age of Onset  . Huntington's disease Father        Deceased, 85 Pt tested negative  . Lung cancer Mother        Deceased 68  . Heart attack Paternal Grandfather   . Coronary artery disease Paternal Grandfather   . Heart attack Maternal Grandfather   . Coronary artery disease Maternal Grandfather   . Healthy Brother        Tested negative for Huntington's     Review of Systems  Constitutional: Negative.   HENT: Negative.   Eyes: Negative.   Respiratory: Negative.   Cardiovascular: Negative.   Gastrointestinal: Negative.   Genitourinary: Negative.   Musculoskeletal: Positive for joint pain.  Skin: Negative.   Neurological: Negative.   Endo/Heme/Allergies: Negative.   Psychiatric/Behavioral: Negative.       Objective:  Physical Exam Constitutional:      Appearance: He is well-developed.  HENT:     Head: Normocephalic.  Eyes:     Pupils: Pupils are equal, round, and reactive to light.  Neck:     Thyroid: No thyromegaly.     Vascular: No JVD.     Trachea: No tracheal deviation.  Cardiovascular:     Rate and Rhythm: Normal rate and regular rhythm.     Pulses: Intact distal pulses.  Pulmonary:     Effort: Pulmonary effort is normal. No respiratory distress.     Breath sounds: Normal breath sounds. No wheezing.  Abdominal:     Palpations: Abdomen is soft.     Tenderness: There is no abdominal tenderness. There is no guarding.  Musculoskeletal:     Cervical back: Neck supple.     Right knee: Swelling and bony tenderness present. No erythema or ecchymosis. Decreased range of motion. Tenderness present.  Lymphadenopathy:     Cervical: No cervical adenopathy.  Skin:    General: Skin is warm and dry.  Neurological:      Mental Status: He is alert and oriented to person, place, and time.  Psychiatric:        Mood and Affect: Mood and affect normal.       Labs:  Estimated body mass index is 30.54 kg/m as calculated from the following:   Height as of 06/21/20: 5\' 6"  (1.676 m).  Weight as of 06/21/20: 85.8 kg.   Imaging Review Plain radiographs demonstrate severe degenerative joint disease of the right knee.  The bone quality appears to be good for age and reported activity level.      Assessment/Plan:  End stage arthritis, right knee   The patient history, physical examination, clinical judgment of the provider and imaging studies are consistent with end stage degenerative joint disease of the right knee(s) and total knee arthroplasty is deemed medically necessary. The treatment options including medical management, injection therapy arthroscopy and arthroplasty were discussed at length. The risks and benefits of total knee arthroplasty were presented and reviewed. The risks due to aseptic loosening, infection, stiffness, patella tracking problems, thromboembolic complications and other imponderables were discussed. The patient acknowledged the explanation, agreed to proceed with the plan and consent was signed. Patient is being admitted for treatment for surgery, pain control, PT, OT, prophylactic antibiotics, VTE prophylaxis, progressive ambulation and ADL's and discharge planning. The patient is planning to be discharged home.     Patient's anticipated LOS is less than 2 midnights, meeting these requirements: - Lives within 1 hour of care - Has a competent adult at home to recover with post-op recover - NO history of  - Chronic pain requiring opiods  - Diabetes  - Coronary Artery Disease  - Heart failure  - Heart attack  - Stroke  - DVT/VTE  - Cardiac arrhythmia  - Respiratory Failure/COPD  - Renal failure  - Anemia  - Advanced Liver disease

## 2020-06-24 ENCOUNTER — Other Ambulatory Visit (HOSPITAL_COMMUNITY)
Admission: RE | Admit: 2020-06-24 | Discharge: 2020-06-24 | Disposition: A | Discharge status: Home / Self Care | Payer: Medicare Other | Source: Ambulatory Visit | Attending: Orthopedic Surgery | Admitting: Orthopedic Surgery

## 2020-06-24 DIAGNOSIS — R7303 Prediabetes: Secondary | ICD-10-CM | POA: Diagnosis not present

## 2020-06-24 DIAGNOSIS — E782 Mixed hyperlipidemia: Secondary | ICD-10-CM | POA: Diagnosis not present

## 2020-06-24 DIAGNOSIS — N401 Enlarged prostate with lower urinary tract symptoms: Secondary | ICD-10-CM | POA: Diagnosis not present

## 2020-06-24 DIAGNOSIS — Z01812 Encounter for preprocedural laboratory examination: Secondary | ICD-10-CM | POA: Insufficient documentation

## 2020-06-24 DIAGNOSIS — Z0000 Encounter for general adult medical examination without abnormal findings: Secondary | ICD-10-CM | POA: Diagnosis not present

## 2020-06-24 DIAGNOSIS — Z20822 Contact with and (suspected) exposure to covid-19: Secondary | ICD-10-CM | POA: Insufficient documentation

## 2020-06-24 DIAGNOSIS — G4733 Obstructive sleep apnea (adult) (pediatric): Secondary | ICD-10-CM | POA: Diagnosis not present

## 2020-06-24 DIAGNOSIS — M6208 Separation of muscle (nontraumatic), other site: Secondary | ICD-10-CM | POA: Diagnosis not present

## 2020-06-24 DIAGNOSIS — Z125 Encounter for screening for malignant neoplasm of prostate: Secondary | ICD-10-CM | POA: Diagnosis not present

## 2020-06-24 DIAGNOSIS — Z1211 Encounter for screening for malignant neoplasm of colon: Secondary | ICD-10-CM | POA: Diagnosis not present

## 2020-06-24 DIAGNOSIS — I10 Essential (primary) hypertension: Secondary | ICD-10-CM | POA: Diagnosis not present

## 2020-06-24 DIAGNOSIS — M25561 Pain in right knee: Secondary | ICD-10-CM | POA: Diagnosis not present

## 2020-06-24 DIAGNOSIS — J309 Allergic rhinitis, unspecified: Secondary | ICD-10-CM | POA: Diagnosis not present

## 2020-06-24 LAB — SARS CORONAVIRUS 2 (TAT 6-24 HRS): SARS Coronavirus 2: NEGATIVE

## 2020-06-28 ENCOUNTER — Encounter (HOSPITAL_COMMUNITY): Payer: Self-pay | Admitting: Orthopedic Surgery

## 2020-06-28 ENCOUNTER — Observation Stay (HOSPITAL_COMMUNITY)
Admission: RE | Admit: 2020-06-28 | Discharge: 2020-06-29 | Disposition: A | Discharge status: Home / Self Care | Payer: Medicare Other | Source: Ambulatory Visit | Attending: Orthopedic Surgery | Admitting: Orthopedic Surgery

## 2020-06-28 ENCOUNTER — Ambulatory Visit (HOSPITAL_COMMUNITY): Payer: Medicare Other | Admitting: Physician Assistant

## 2020-06-28 ENCOUNTER — Ambulatory Visit (HOSPITAL_COMMUNITY): Payer: Medicare Other | Admitting: Anesthesiology

## 2020-06-28 ENCOUNTER — Encounter (HOSPITAL_COMMUNITY)
Admission: RE | Disposition: A | Discharge status: Home / Self Care | Payer: Self-pay | Source: Ambulatory Visit | Attending: Orthopedic Surgery

## 2020-06-28 ENCOUNTER — Other Ambulatory Visit: Payer: Self-pay

## 2020-06-28 DIAGNOSIS — I10 Essential (primary) hypertension: Secondary | ICD-10-CM | POA: Insufficient documentation

## 2020-06-28 DIAGNOSIS — Z96651 Presence of right artificial knee joint: Secondary | ICD-10-CM

## 2020-06-28 DIAGNOSIS — Z7982 Long term (current) use of aspirin: Secondary | ICD-10-CM | POA: Diagnosis not present

## 2020-06-28 DIAGNOSIS — G4733 Obstructive sleep apnea (adult) (pediatric): Secondary | ICD-10-CM | POA: Diagnosis not present

## 2020-06-28 DIAGNOSIS — Z79899 Other long term (current) drug therapy: Secondary | ICD-10-CM | POA: Diagnosis not present

## 2020-06-28 DIAGNOSIS — M1711 Unilateral primary osteoarthritis, right knee: Principal | ICD-10-CM | POA: Diagnosis present

## 2020-06-28 DIAGNOSIS — G8918 Other acute postprocedural pain: Secondary | ICD-10-CM | POA: Diagnosis not present

## 2020-06-28 DIAGNOSIS — M25561 Pain in right knee: Secondary | ICD-10-CM | POA: Diagnosis present

## 2020-06-28 HISTORY — PX: TOTAL KNEE ARTHROPLASTY: SHX125

## 2020-06-28 LAB — TYPE AND SCREEN
ABO/RH(D): O POS
Antibody Screen: NEGATIVE

## 2020-06-28 LAB — ABO/RH: ABO/RH(D): O POS

## 2020-06-28 SURGERY — ARTHROPLASTY, KNEE, TOTAL
Anesthesia: Regional | Site: Knee | Laterality: Right

## 2020-06-28 MED ORDER — HYDROMORPHONE HCL 1 MG/ML IJ SOLN
0.5000 mg | INTRAMUSCULAR | Status: DC | PRN
  Administered 2020-06-28: 1 mg via INTRAVENOUS
  Filled 2020-06-28: qty 1

## 2020-06-28 MED ORDER — METOCLOPRAMIDE HCL 5 MG PO TABS: 5.0000 mg | ORAL_TABLET | Freq: Three times a day (TID) | ORAL | Status: DC | PRN

## 2020-06-28 MED ORDER — ACETAMINOPHEN 10 MG/ML IV SOLN: 1000.0000 mg | Freq: Once | INTRAVENOUS | Status: DC | PRN

## 2020-06-28 MED ORDER — TRANEXAMIC ACID-NACL 1000-0.7 MG/100ML-% IV SOLN: 1000.0000 mg | Freq: Once | INTRAVENOUS | Status: DC

## 2020-06-28 MED ORDER — PHENYLEPHRINE HCL (PRESSORS) 10 MG/ML IV SOLN
INTRAVENOUS | Status: AC
  Filled 2020-06-28: qty 1

## 2020-06-28 MED ORDER — MENTHOL 3 MG MT LOZG
1.0000 | LOZENGE | OROMUCOSAL | Status: DC | PRN
  Administered 2020-06-28: 18:00:00 3 mg via ORAL
  Filled 2020-06-28: qty 9

## 2020-06-28 MED ORDER — ALUM & MAG HYDROXIDE-SIMETH 200-200-20 MG/5ML PO SUSP: 15.0000 mL | ORAL | Status: DC | PRN

## 2020-06-28 MED ORDER — DEXAMETHASONE SODIUM PHOSPHATE 10 MG/ML IJ SOLN
10.0000 mg | Freq: Once | INTRAMUSCULAR | Status: AC
  Administered 2020-06-29: 08:00:00 10 mg via INTRAVENOUS
  Filled 2020-06-28: qty 1

## 2020-06-28 MED ORDER — LORATADINE 10 MG PO TABS
10.0000 mg | ORAL_TABLET | Freq: Every day | ORAL | Status: DC
  Administered 2020-06-28 – 2020-06-29 (×2): 10 mg via ORAL
  Filled 2020-06-28 (×2): qty 1

## 2020-06-28 MED ORDER — FENTANYL CITRATE (PF) 100 MCG/2ML IJ SOLN
50.0000 ug | Freq: Once | INTRAMUSCULAR | Status: DC
  Filled 2020-06-28: qty 2

## 2020-06-28 MED ORDER — DEXAMETHASONE SODIUM PHOSPHATE 10 MG/ML IJ SOLN
10.0000 mg | Freq: Once | INTRAMUSCULAR | Status: AC
  Administered 2020-06-28: 8 mg via INTRAVENOUS

## 2020-06-28 MED ORDER — HYDROCODONE-ACETAMINOPHEN 5-325 MG PO TABS
1.0000 | ORAL_TABLET | ORAL | Status: DC | PRN
  Administered 2020-06-28: 1 via ORAL
  Filled 2020-06-28: qty 1

## 2020-06-28 MED ORDER — DOCUSATE SODIUM 100 MG PO CAPS
100.0000 mg | ORAL_CAPSULE | Freq: Two times a day (BID) | ORAL | Status: DC
Start: 2020-06-28 — End: 2020-06-29
  Administered 2020-06-28 – 2020-06-29 (×2): 100 mg via ORAL
  Filled 2020-06-28 (×2): qty 1

## 2020-06-28 MED ORDER — BUPIVACAINE-EPINEPHRINE (PF) 0.25% -1:200000 IJ SOLN
INTRAMUSCULAR | Status: AC
  Filled 2020-06-28: qty 30

## 2020-06-28 MED ORDER — LIDOCAINE 2% (20 MG/ML) 5 ML SYRINGE
INTRAMUSCULAR | Status: DC | PRN
  Administered 2020-06-28: 40 mg via INTRAVENOUS

## 2020-06-28 MED ORDER — ACETAMINOPHEN 325 MG PO TABS: 325.0000 mg | ORAL_TABLET | Freq: Four times a day (QID) | ORAL | Status: DC | PRN

## 2020-06-28 MED ORDER — ONDANSETRON HCL 4 MG/2ML IJ SOLN: 4.0000 mg | Freq: Four times a day (QID) | INTRAMUSCULAR | Status: DC | PRN

## 2020-06-28 MED ORDER — DIPHENHYDRAMINE HCL 12.5 MG/5ML PO ELIX: 12.5000 mg | ORAL_SOLUTION | ORAL | Status: DC | PRN

## 2020-06-28 MED ORDER — BUPIVACAINE IN DEXTROSE 0.75-8.25 % IT SOLN
INTRATHECAL | Status: DC | PRN
  Administered 2020-06-28: 1.6 mL via INTRATHECAL

## 2020-06-28 MED ORDER — HYDROCODONE-ACETAMINOPHEN 7.5-325 MG PO TABS
1.0000 | ORAL_TABLET | ORAL | Status: DC | PRN
  Administered 2020-06-28: 2 via ORAL
  Administered 2020-06-28: 1 via ORAL
  Administered 2020-06-29 (×2): 2 via ORAL
  Filled 2020-06-28: qty 1
  Filled 2020-06-28 (×3): qty 2

## 2020-06-28 MED ORDER — POLYETHYLENE GLYCOL 3350 17 G PO PACK
17.0000 g | PACK | Freq: Two times a day (BID) | ORAL | Status: DC
  Administered 2020-06-28 – 2020-06-29 (×2): 17 g via ORAL
  Filled 2020-06-28 (×2): qty 1

## 2020-06-28 MED ORDER — SODIUM CHLORIDE 0.9 % IV SOLN: INTRAVENOUS | Status: DC

## 2020-06-28 MED ORDER — SODIUM CHLORIDE 0.9 % IR SOLN
Status: DC | PRN
  Administered 2020-06-28: 1000 mL

## 2020-06-28 MED ORDER — PROPOFOL 1000 MG/100ML IV EMUL
INTRAVENOUS | Status: AC
  Filled 2020-06-28: qty 100

## 2020-06-28 MED ORDER — TRANEXAMIC ACID-NACL 1000-0.7 MG/100ML-% IV SOLN
1000.0000 mg | INTRAVENOUS | Status: AC
  Administered 2020-06-28: 1000 mg via INTRAVENOUS
  Filled 2020-06-28: qty 100

## 2020-06-28 MED ORDER — KETOROLAC TROMETHAMINE 30 MG/ML IJ SOLN
INTRAMUSCULAR | Status: DC | PRN
  Administered 2020-06-28: 30 mg

## 2020-06-28 MED ORDER — POVIDONE-IODINE 10 % EX SWAB
2.0000 "application " | Freq: Once | CUTANEOUS | Status: AC
  Administered 2020-06-28: 2 via TOPICAL

## 2020-06-28 MED ORDER — DEXMEDETOMIDINE (PRECEDEX) IN NS 20 MCG/5ML (4 MCG/ML) IV SYRINGE
PREFILLED_SYRINGE | INTRAVENOUS | Status: AC
  Filled 2020-06-28: qty 15

## 2020-06-28 MED ORDER — METHOCARBAMOL 500 MG IVPB - SIMPLE MED
500.0000 mg | Freq: Four times a day (QID) | INTRAVENOUS | Status: DC | PRN
  Filled 2020-06-28: qty 50

## 2020-06-28 MED ORDER — LACTATED RINGERS IV SOLN: INTRAVENOUS | Status: DC

## 2020-06-28 MED ORDER — METOCLOPRAMIDE HCL 5 MG/ML IJ SOLN: 5.0000 mg | Freq: Three times a day (TID) | INTRAMUSCULAR | Status: DC | PRN

## 2020-06-28 MED ORDER — MIDAZOLAM HCL 5 MG/5ML IJ SOLN
INTRAMUSCULAR | Status: DC | PRN
  Administered 2020-06-28 (×2): 1 mg via INTRAVENOUS

## 2020-06-28 MED ORDER — ONDANSETRON HCL 4 MG/2ML IJ SOLN: 4.0000 mg | Freq: Once | INTRAMUSCULAR | Status: DC | PRN

## 2020-06-28 MED ORDER — ONDANSETRON HCL 4 MG PO TABS: 4.0000 mg | ORAL_TABLET | Freq: Four times a day (QID) | ORAL | Status: DC | PRN

## 2020-06-28 MED ORDER — PROPOFOL 500 MG/50ML IV EMUL
INTRAVENOUS | Status: DC | PRN
  Administered 2020-06-28: 75 ug/kg/min via INTRAVENOUS

## 2020-06-28 MED ORDER — MAGNESIUM CITRATE PO SOLN: 1.0000 | Freq: Once | ORAL | Status: DC | PRN

## 2020-06-28 MED ORDER — METHOCARBAMOL 500 MG PO TABS
500.0000 mg | ORAL_TABLET | Freq: Four times a day (QID) | ORAL | Status: DC | PRN
  Administered 2020-06-29: 06:00:00 500 mg via ORAL
  Filled 2020-06-28: qty 1

## 2020-06-28 MED ORDER — IRBESARTAN 150 MG PO TABS
150.0000 mg | ORAL_TABLET | Freq: Every evening | ORAL | Status: DC
  Administered 2020-06-28: 22:00:00 150 mg via ORAL
  Filled 2020-06-28: qty 1

## 2020-06-28 MED ORDER — DEXMEDETOMIDINE (PRECEDEX) IN NS 20 MCG/5ML (4 MCG/ML) IV SYRINGE
PREFILLED_SYRINGE | INTRAVENOUS | Status: DC | PRN
  Administered 2020-06-28: 8 ug via INTRAVENOUS

## 2020-06-28 MED ORDER — BUPIVACAINE-EPINEPHRINE (PF) 0.5% -1:200000 IJ SOLN
INTRAMUSCULAR | Status: DC | PRN
Start: 2020-06-28 — End: 2020-06-28
  Administered 2020-06-28: 30 mL via PERINEURAL

## 2020-06-28 MED ORDER — FENTANYL CITRATE (PF) 100 MCG/2ML IJ SOLN: 25.0000 ug | INTRAMUSCULAR | Status: DC | PRN

## 2020-06-28 MED ORDER — ASPIRIN 81 MG PO CHEW
81.0000 mg | CHEWABLE_TABLET | Freq: Two times a day (BID) | ORAL | Status: DC
  Administered 2020-06-28 – 2020-06-29 (×2): 81 mg via ORAL
  Filled 2020-06-28 (×2): qty 1

## 2020-06-28 MED ORDER — CEFAZOLIN SODIUM-DEXTROSE 2-4 GM/100ML-% IV SOLN
2.0000 g | Freq: Four times a day (QID) | INTRAVENOUS | Status: AC
  Administered 2020-06-28 (×2): 2 g via INTRAVENOUS
  Filled 2020-06-28 (×2): qty 100

## 2020-06-28 MED ORDER — BUPIVACAINE-EPINEPHRINE (PF) 0.25% -1:200000 IJ SOLN
INTRAMUSCULAR | Status: DC | PRN
  Administered 2020-06-28: 30 mL

## 2020-06-28 MED ORDER — CELECOXIB 200 MG PO CAPS
200.0000 mg | ORAL_CAPSULE | Freq: Two times a day (BID) | ORAL | Status: DC
  Administered 2020-06-28 – 2020-06-29 (×2): 200 mg via ORAL
  Filled 2020-06-28 (×2): qty 1

## 2020-06-28 MED ORDER — SODIUM CHLORIDE (PF) 0.9 % IJ SOLN
INTRAMUSCULAR | Status: DC | PRN
  Administered 2020-06-28: 30 mL

## 2020-06-28 MED ORDER — KETOROLAC TROMETHAMINE 30 MG/ML IJ SOLN
INTRAMUSCULAR | Status: AC
  Filled 2020-06-28: qty 1

## 2020-06-28 MED ORDER — OLMESARTAN MEDOXOMIL-HCTZ 20-12.5 MG PO TABS: 1.0000 | ORAL_TABLET | Freq: Every evening | ORAL | Status: DC

## 2020-06-28 MED ORDER — BISACODYL 10 MG RE SUPP: 10.0000 mg | Freq: Every day | RECTAL | Status: DC | PRN

## 2020-06-28 MED ORDER — CEFAZOLIN SODIUM-DEXTROSE 2-4 GM/100ML-% IV SOLN
2.0000 g | INTRAVENOUS | Status: AC
  Administered 2020-06-28: 2 g via INTRAVENOUS
  Filled 2020-06-28: qty 100

## 2020-06-28 MED ORDER — CHLORHEXIDINE GLUCONATE 0.12 % MT SOLN
15.0000 mL | Freq: Once | OROMUCOSAL | Status: AC
  Administered 2020-06-28: 08:00:00 15 mL via OROMUCOSAL

## 2020-06-28 MED ORDER — MIDAZOLAM HCL 2 MG/2ML IJ SOLN
1.0000 mg | Freq: Once | INTRAMUSCULAR | Status: AC
  Administered 2020-06-28: 09:00:00 2 mg via INTRAVENOUS
  Filled 2020-06-28: qty 2

## 2020-06-28 MED ORDER — PHENYLEPHRINE HCL-NACL 10-0.9 MG/250ML-% IV SOLN
INTRAVENOUS | Status: DC | PRN
  Administered 2020-06-28: 35 ug/min via INTRAVENOUS

## 2020-06-28 MED ORDER — ONDANSETRON HCL 4 MG/2ML IJ SOLN
INTRAMUSCULAR | Status: DC | PRN
  Administered 2020-06-28: 4 mg via INTRAVENOUS

## 2020-06-28 MED ORDER — GLYCOPYRROLATE 0.2 MG/ML IJ SOLN
INTRAMUSCULAR | Status: DC | PRN
  Administered 2020-06-28: .2 mg via INTRAVENOUS

## 2020-06-28 MED ORDER — PHENOL 1.4 % MT LIQD: 1.0000 | OROMUCOSAL | Status: DC | PRN

## 2020-06-28 MED ORDER — ORAL CARE MOUTH RINSE: 15.0000 mL | Freq: Once | OROMUCOSAL | Status: AC

## 2020-06-28 MED ORDER — SODIUM CHLORIDE (PF) 0.9 % IJ SOLN
INTRAMUSCULAR | Status: AC
  Filled 2020-06-28: qty 50

## 2020-06-28 MED ORDER — HYDROCHLOROTHIAZIDE 12.5 MG PO CAPS
12.5000 mg | ORAL_CAPSULE | Freq: Every evening | ORAL | Status: DC
  Administered 2020-06-28: 22:00:00 12.5 mg via ORAL
  Filled 2020-06-28: qty 1

## 2020-06-28 MED ORDER — MIDAZOLAM HCL 2 MG/2ML IJ SOLN
INTRAMUSCULAR | Status: AC
  Filled 2020-06-28: qty 2

## 2020-06-28 MED ORDER — FERROUS SULFATE 325 (65 FE) MG PO TABS
325.0000 mg | ORAL_TABLET | Freq: Two times a day (BID) | ORAL | Status: DC
  Administered 2020-06-28 – 2020-06-29 (×2): 325 mg via ORAL
  Filled 2020-06-28 (×2): qty 1

## 2020-06-28 SURGICAL SUPPLY — 59 items
ATTUNE MED ANAT PAT 38 KNEE (Knees) ×2 IMPLANT
ATTUNE PS FEM RT SZ 4 CEM KNEE (Femur) ×2 IMPLANT
ATTUNE PSRP INSR SZ4 5 KNEE (Insert) ×2 IMPLANT
BAG ZIPLOCK 12X15 (MISCELLANEOUS) IMPLANT
BASE TIBIAL ROT PLAT SZ 5 KNEE (Knees) ×1 IMPLANT
BLADE SAW SGTL 11.0X1.19X90.0M (BLADE) IMPLANT
BLADE SAW SGTL 13.0X1.19X90.0M (BLADE) ×2 IMPLANT
BLADE SURG SZ10 CARB STEEL (BLADE) ×4 IMPLANT
BNDG ELASTIC 6X5.8 VLCR STR LF (GAUZE/BANDAGES/DRESSINGS) ×2 IMPLANT
BOWL SMART MIX CTS (DISPOSABLE) ×2 IMPLANT
CEMENT HV SMART SET (Cement) ×4 IMPLANT
COVER SURGICAL LIGHT HANDLE (MISCELLANEOUS) ×2 IMPLANT
COVER WAND RF STERILE (DRAPES) IMPLANT
CUFF TOURN SGL QUICK 34 (TOURNIQUET CUFF) ×2
CUFF TRNQT CYL 34X4.125X (TOURNIQUET CUFF) ×1 IMPLANT
DECANTER SPIKE VIAL GLASS SM (MISCELLANEOUS) ×4 IMPLANT
DERMABOND ADVANCED (GAUZE/BANDAGES/DRESSINGS) ×1
DERMABOND ADVANCED .7 DNX12 (GAUZE/BANDAGES/DRESSINGS) ×1 IMPLANT
DRAPE U-SHAPE 47X51 STRL (DRAPES) ×2 IMPLANT
DRESSING AQUACEL AG SP 3.5X10 (GAUZE/BANDAGES/DRESSINGS) ×1 IMPLANT
DRSG AQUACEL AG SP 3.5X10 (GAUZE/BANDAGES/DRESSINGS) ×2
DURAPREP 26ML APPLICATOR (WOUND CARE) ×4 IMPLANT
ELECT REM PT RETURN 15FT ADLT (MISCELLANEOUS) ×2 IMPLANT
GLOVE BIO SURGEON STRL SZ 6 (GLOVE) ×2 IMPLANT
GLOVE BIOGEL PI IND STRL 6.5 (GLOVE) ×1 IMPLANT
GLOVE BIOGEL PI IND STRL 7.5 (GLOVE) ×1 IMPLANT
GLOVE BIOGEL PI IND STRL 8.5 (GLOVE) IMPLANT
GLOVE BIOGEL PI INDICATOR 6.5 (GLOVE) ×1
GLOVE BIOGEL PI INDICATOR 7.5 (GLOVE) ×1
GLOVE BIOGEL PI INDICATOR 8.5 (GLOVE)
GLOVE ECLIPSE 8.0 STRL XLNG CF (GLOVE) IMPLANT
GLOVE ORTHO TXT STRL SZ7.5 (GLOVE) ×2 IMPLANT
GOWN STRL REUS W/ TWL LRG LVL3 (GOWN DISPOSABLE) ×1 IMPLANT
GOWN STRL REUS W/TWL 2XL LVL3 (GOWN DISPOSABLE) ×2 IMPLANT
GOWN STRL REUS W/TWL LRG LVL3 (GOWN DISPOSABLE) ×4 IMPLANT
HANDPIECE INTERPULSE COAX TIP (DISPOSABLE) ×2
HOLDER FOLEY CATH W/STRAP (MISCELLANEOUS) IMPLANT
KIT TURNOVER KIT A (KITS) ×2 IMPLANT
MANIFOLD NEPTUNE II (INSTRUMENTS) ×2 IMPLANT
NDL SAFETY ECLIPSE 18X1.5 (NEEDLE) IMPLANT
NEEDLE HYPO 18GX1.5 SHARP (NEEDLE)
NS IRRIG 1000ML POUR BTL (IV SOLUTION) ×2 IMPLANT
PACK TOTAL KNEE CUSTOM (KITS) ×2 IMPLANT
PENCIL SMOKE EVACUATOR (MISCELLANEOUS) ×2 IMPLANT
PIN DRILL FIX HALF THREAD (BIT) ×2 IMPLANT
PIN FIX SIGMA LCS THRD HI (PIN) ×2 IMPLANT
PROTECTOR NERVE ULNAR (MISCELLANEOUS) ×2 IMPLANT
SET HNDPC FAN SPRY TIP SCT (DISPOSABLE) ×1 IMPLANT
SET PAD KNEE POSITIONER (MISCELLANEOUS) ×2 IMPLANT
SUT MNCRL AB 4-0 PS2 18 (SUTURE) ×2 IMPLANT
SUT STRATAFIX PDS+ 0 24IN (SUTURE) ×2 IMPLANT
SUT VIC AB 1 CT1 36 (SUTURE) ×2 IMPLANT
SUT VIC AB 2-0 CT1 27 (SUTURE) ×6
SUT VIC AB 2-0 CT1 TAPERPNT 27 (SUTURE) ×3 IMPLANT
SYR 3ML LL SCALE MARK (SYRINGE) ×2 IMPLANT
TIBIAL BASE ROT PLAT SZ 5 KNEE (Knees) ×2 IMPLANT
TRAY FOLEY MTR SLVR 16FR STAT (SET/KITS/TRAYS/PACK) ×2 IMPLANT
WATER STERILE IRR 1000ML POUR (IV SOLUTION) ×4 IMPLANT
WRAP KNEE MAXI GEL POST OP (GAUZE/BANDAGES/DRESSINGS) ×2 IMPLANT

## 2020-06-28 NOTE — Plan of Care (Signed)
  Problem: Education: Goal: Knowledge of General Education information will improve Description: Including pain rating scale, medication(s)/side effects and non-pharmacologic comfort measures Outcome: Progressing   Problem: Clinical Measurements: Goal: Ability to maintain clinical measurements within normal limits will improve Outcome: Progressing Goal: Will remain free from infection Outcome: Progressing Goal: Diagnostic test results will improve Outcome: Progressing Goal: Cardiovascular complication will be avoided Outcome: Progressing   Problem: Activity: Goal: Risk for activity intolerance will decrease Outcome: Progressing   Problem: Nutrition: Goal: Adequate nutrition will be maintained Outcome: Progressing   Problem: Coping: Goal: Level of anxiety will decrease Outcome: Progressing   Problem: Elimination: Goal: Will not experience complications related to bowel motility Outcome: Progressing Goal: Will not experience complications related to urinary retention Outcome: Progressing   Problem: Pain Managment: Goal: General experience of comfort will improve Outcome: Progressing   Problem: Safety: Goal: Ability to remain free from injury will improve Outcome: Progressing   Problem: Education: Goal: Knowledge of the prescribed therapeutic regimen will improve Outcome: Progressing   Problem: Activity: Goal: Ability to avoid complications of mobility impairment will improve Outcome: Progressing Goal: Range of joint motion will improve Outcome: Progressing   Problem: Pain Management: Goal: Pain level will decrease with appropriate interventions Outcome: Progressing

## 2020-06-28 NOTE — Anesthesia Preprocedure Evaluation (Addendum)
Anesthesia Evaluation  Patient identified by MRN, date of birth, ID band Patient awake    Reviewed: Allergy & Precautions, NPO status , Patient's Chart, lab work & pertinent test results  Airway Mallampati: II  TM Distance: >3 FB Neck ROM: Full    Dental no notable dental hx.    Pulmonary sleep apnea ,    Pulmonary exam normal breath sounds clear to auscultation       Cardiovascular hypertension, Pt. on medications Normal cardiovascular exam Rhythm:Regular Rate:Normal  ECG: rate 83  ECHO: 1. Left ventricular ejection fraction, by estimation, is 55 to 60%. The left ventricle has normal function. The left ventricle has no regional wall motion abnormalities. Left ventricular diastolic parameters are indeterminate. 2. Right ventricular systolic function is normal. The right ventricular size is normal. Tricuspid regurgitation signal is inadequate for assessing PA pressure. 3. The mitral valve is grossly normal. Trivial mitral valve regurgitation. No evidence of mitral stenosis. 4. The aortic valve is tricuspid. Aortic valve regurgitation is not visualized. No aortic stenosis is present. 5. The inferior vena cava is normal in size with greater than 50% respiratory variability, suggesting right atrial pressure of 3 mmHg.   Neuro/Psych PSYCHIATRIC DISORDERS Depression negative neurological ROS     GI/Hepatic Neg liver ROS, PUD,   Endo/Other  negative endocrine ROS  Renal/GU negative Renal ROS     Musculoskeletal  (+) Arthritis ,   Abdominal (+) + obese,   Peds  Hematology HLD   Anesthesia Other Findings Right knee osteoarthritis  Reproductive/Obstetrics                            Anesthesia Physical Anesthesia Plan  ASA: II  Anesthesia Plan: Spinal and Regional   Post-op Pain Management:  Regional for Post-op pain   Induction: Intravenous  PONV Risk Score and Plan: 1 and Ondansetron,  Dexamethasone, Propofol infusion and Treatment may vary due to age or medical condition  Airway Management Planned: Simple Face Mask  Additional Equipment:   Intra-op Plan:   Post-operative Plan:   Informed Consent: I have reviewed the patients History and Physical, chart, labs and discussed the procedure including the risks, benefits and alternatives for the proposed anesthesia with the patient or authorized representative who has indicated his/her understanding and acceptance.     Dental advisory given  Plan Discussed with: CRNA  Anesthesia Plan Comments:         Anesthesia Quick Evaluation

## 2020-06-28 NOTE — Care Plan (Signed)
Ortho Bundle Case Management Note  Patient Details  Name: Hunter Stone MRN: [id number] Date of Birth: Jun 22, 1951                  R TKA on 06/28/20. DCP: Home with wife. 1 story home with 3 steps. DME: RW & 3in1 ordered through [facility]. PT: EO 12/17   DME Arranged:  [facility] [place] Agency:  [employer]  HH Arranged:    [place] Agency:     Additional Comments: Please contact me with any questions of if this plan should need to change.  Marianne Sofia, RN,CCM [facility]  [phone] 06/28/2020, 11:56 AM

## 2020-06-28 NOTE — Transfer of Care (Signed)
Immediate Anesthesia Transfer of Care Note  Patient: Hunter Stone  Procedure(s) Performed: Procedure(s) with comments: TOTAL KNEE ARTHROPLASTY (Right) - 70 mins  Patient Location: PACU  Anesthesia Type:Spinal  Level of Consciousness:  sedated, patient cooperative and responds to stimulation  Airway & Oxygen Therapy:Patient Spontanous Breathing and Patient connected to face mask oxgen  Post-op Assessment:  Report given to PACU RN and Post -op Vital signs reviewed and stable  Post vital signs:  Reviewed and stable  Last Vitals:  Vitals:   06/28/20 0951 06/28/20 1249  BP: 124/68 102/63  Pulse: 63 72  Resp: 17 16  Temp:  (!) 36.4 C  SpO2: 559% 741%    Complications: No apparent anesthesia complications

## 2020-06-28 NOTE — Evaluation (Signed)
Physical Therapy Evaluation Patient Details Name: Hunter Stone MRN: [id number] DOB: 1950/10/26 Today's Date: 06/28/2020   History of Present Illness  s/p R TKA. PMH: R biceps tendon repair, CTS  Clinical Impression  Pt is s/p TKA resulting in the deficits listed below (see PT Problem List).  Pt amb ~ 26' with min/guard assist, anticipate steady progress in acute setting.  Pt will benefit from skilled PT to increase their independence and safety with mobility to allow discharge to the venue listed below.      Follow Up Recommendations Follow surgeon's recommendation for DC plan and follow-up therapies    Equipment Recommendations  Rolling walker with 5" wheels;3in1 (PT)    Recommendations for Other Services       Precautions / Restrictions Precautions Precautions: Fall;Knee Restrictions Weight Bearing Restrictions: No Other Position/Activity Restrictions: WBAT      Mobility  Bed Mobility Overal bed mobility: Needs Assistance Bed Mobility: Supine to Sit     Supine to sit: Min guard     General bed mobility comments: for safety    Transfers Overall transfer level: Needs assistance Equipment used: Rolling walker (2 wheeled) Transfers: Sit to/from Stand Sit to Stand: Min guard;Min assist         General transfer comment: cues for hand placement, light assist to rise and transition to RW  Ambulation/Gait Ambulation/Gait assistance: Min guard;Min assist Gait Distance (Feet): 50 Feet Assistive device: Rolling walker (2 wheeled) Gait Pattern/deviations: Step-to pattern Gait velocity: decr   General Gait Details: cues for sequence, RW position, to stay with "step to" gait d/t mild glut numbness related to anesthesia, no knee buckling or instability noted  Stairs            Wheelchair Mobility    Modified Rankin (Stroke Patients Only)       Balance                                             Pertinent Vitals/Pain Pain  Assessment: 0-10 Pain Score: 3  Pain Location: right knee Pain Intervention(s): Limited activity within patient's tolerance;Monitored during session;Repositioned;Ice applied    Home Living Family/patient expects to be discharged to:: Private residence Living Arrangements: Spouse/significant other Available Help at Discharge: Family Type of Home: House Home Access: Stairs to enter Entrance Stairs-Rails: Psychiatric nurse of Steps: 3 Home Layout: One level Home Equipment: None      Prior Function Level of Independence: Independent               Hand Dominance        Extremity/Trunk Assessment   Upper Extremity Assessment Upper Extremity Assessment: Overall WFL for tasks assessed    Lower Extremity Assessment Lower Extremity Assessment: RLE deficits/detail RLE Deficits / Details: ankle WFL, knee extension and hip flexion grossly 3/5. knee AAROm  `5 to 65 degrees flexion       Communication   Communication: No difficulties  Cognition Arousal/Alertness: Awake/alert Behavior During Therapy: WFL for tasks assessed/performed Overall Cognitive Status: Within Functional Limits for tasks assessed                                        General Comments      Exercises Total Joint Exercises Ankle Circles/Pumps: AROM;Both;10 reps Quad Sets: AROM;5 reps;Both  Assessment/Plan    PT Assessment Patient needs continued PT services  PT Problem List Decreased strength;Decreased mobility;Decreased range of motion;Decreased activity tolerance;Decreased knowledge of use of DME;Pain       PT Treatment Interventions DME instruction;Therapeutic exercise;Gait training;Stair training;Functional mobility training;Therapeutic activities;Patient/family education    PT Goals (Current goals can be found in the Care Plan section)  Acute Rehab PT Goals Patient Stated Goal: return to independence, have less knee  pain PT Goal Formulation: With  patient Time For Goal Achievement: 07/05/20 Potential to Achieve Goals: Good    Frequency 7X/week   Barriers to discharge        Co-evaluation               AM-PAC PT "6 Clicks" Mobility  Outcome Measure Help needed turning from your back to your side while in a flat bed without using bedrails?: A Little Help needed moving from lying on your back to sitting on the side of a flat bed without using bedrails?: A Little Help needed moving to and from a bed to a chair (including a wheelchair)?: A Little Help needed standing up from a chair using your arms (e.g., wheelchair or bedside chair)?: A Little Help needed to walk in hospital room?: A Little Help needed climbing 3-5 steps with a railing? : A Little 6 Click Score: 18    End of Session Equipment Utilized During Treatment: Gait belt Activity Tolerance: Patient tolerated treatment well Patient left: in chair;with call bell/phone within reach;with chair alarm set   PT Visit Diagnosis: Other abnormalities of gait and mobility (R26.89)    Time: 1540-1600 PT Time Calculation (min) (ACUTE ONLY): 20 min   Charges:   PT Evaluation $PT Eval Low Complexity: [place], PT  Acute Rehab Dept ([place]) [phone] Pager [phone]  06/28/2020   [facility] 06/28/2020, 4:13 PM

## 2020-06-28 NOTE — Plan of Care (Signed)
  Problem: Nutrition: Goal: Adequate nutrition will be maintained Outcome: Progressing   Problem: Pain Managment: Goal: General experience of comfort will improve Outcome: Progressing   

## 2020-06-28 NOTE — Progress Notes (Signed)
AssistedDr. Ellender with right, ultrasound guided, adductor canal block. Side rails up, monitors on throughout procedure. See vital signs in flow sheet. Tolerated Procedure well.  

## 2020-06-28 NOTE — Discharge Instructions (Signed)

## 2020-06-28 NOTE — Anesthesia Procedure Notes (Signed)
Anesthesia Regional Block: Adductor canal block   Pre-Anesthetic Checklist: ,, timeout performed, Correct Patient, Correct Site, Correct Laterality, Correct Procedure,, site marked, risks and benefits discussed, Surgical consent,  Pre-op evaluation,  At surgeon's request and post-op pain management  Laterality: Right  Prep: chloraprep       Needles:  Injection technique: Single-shot  Needle Type: Echogenic Stimulator Needle     Needle Length: 10cm  Needle Gauge: 20     Additional Needles:   Procedures:,,,, ultrasound used (permanent image in chart),,,,  Narrative:  Start time: 06/28/2020 8:55 AM End time: 06/28/2020 9:05 AM Injection made incrementally with aspirations every 5 mL.  Performed by: Personally  Anesthesiologist: Murvin Natal, MD  Additional Notes: Functioning IV was confirmed and monitors were applied. A time-out was performed. Hand hygiene and sterile gloves were used. The thigh was placed in a frog-leg position and prepped in a sterile fashion. A 144mm 20ga BBraun echogenic stimulator needle was placed using ultrasound guidance.  Negative aspiration and negative test dose prior to incremental administration of local anesthetic. The patient tolerated the procedure well.

## 2020-06-28 NOTE — Anesthesia Procedure Notes (Signed)
Spinal  Patient location during procedure: OR Start time: 06/28/2020 11:05 AM End time: 06/28/2020 11:10 AM Staffing Performed: anesthesiologist  Anesthesiologist: Murvin Natal, MD Preanesthetic Checklist Completed: patient identified, IV checked, risks and benefits discussed, surgical consent, monitors and equipment checked, pre-op evaluation and timeout performed Spinal Block Patient position: sitting Prep: DuraPrep Patient monitoring: cardiac monitor, continuous pulse ox and blood pressure Approach: midline Location: L4-5 Injection technique: single-shot Needle Needle type: Pencan  Needle gauge: 24 G Needle length: 9 cm Assessment Sensory level: T10 Additional Notes Functioning IV was confirmed and monitors were applied. Sterile prep and drape, including hand hygiene and sterile gloves were used. The patient was positioned and the spine was prepped. The skin was anesthetized with lidocaine.  Free flow of clear CSF was obtained prior to injecting local anesthetic into the CSF.  The spinal needle aspirated freely following injection.  The needle was carefully withdrawn.  The patient tolerated the procedure well.

## 2020-06-28 NOTE — Interval H&P Note (Signed)
History and Physical Interval Note:  06/28/2020 8:68 AM  Hunter Stone  has presented today for surgery, with the diagnosis of Right knee osteoarthritis.  The various methods of treatment have been discussed with the patient and family. After consideration of risks, benefits and other options for treatment, the patient has consented to  Procedure(s) with comments: TOTAL KNEE ARTHROPLASTY (Right) - 70 mins as a surgical intervention.  The patient's history has been reviewed, patient examined, no change in status, stable for surgery.  I have reviewed the patient's chart and labs.  Questions were answered to the patient's satisfaction.     Mauri Pole

## 2020-06-28 NOTE — Anesthesia Postprocedure Evaluation (Signed)
Anesthesia Post Note  Patient: Hunter Stone  Procedure(s) Performed: TOTAL KNEE ARTHROPLASTY (Right Knee)     Patient location during evaluation: PACU Anesthesia Type: Spinal Level of consciousness: oriented and awake and alert Pain management: pain level controlled Vital Signs Assessment: post-procedure vital signs reviewed and stable Respiratory status: spontaneous breathing, respiratory function stable and nonlabored ventilation Cardiovascular status: blood pressure returned to baseline and stable Postop Assessment: no headache, no backache, no apparent nausea or vomiting, spinal receding and patient able to bend at knees Anesthetic complications: no   No complications documented.  Last Vitals:  Vitals:   06/28/20 1415 06/28/20 1444  BP: (!) 145/83 (!) 154/86  Pulse: 66 (!) 49  Resp: (!) 21 18  Temp:  (!) 36.4 C  SpO2: 100% 100%    Last Pain:  Vitals:   06/28/20 1444  TempSrc: Oral  PainSc:                  Blasa Raisch A.

## 2020-06-28 NOTE — Op Note (Signed)
NAME:  Hunter Stone [facility]                      MEDICAL RECORD NO.:  [id number]                             FACILITY:  [facility]      PHYSICIAN:  Pietro Cassis. Alvan Dame, M.D.  DATE OF BIRTH:  1951-04-20      DATE OF PROCEDURE:  06/28/2020                                     OPERATIVE REPORT         PREOPERATIVE DIAGNOSIS:  Right knee osteoarthritis.      POSTOPERATIVE DIAGNOSIS:  Right knee osteoarthritis.      FINDINGS:  The patient was noted to have complete loss of cartilage and   bone-on-bone arthritis with associated osteophytes in the medial and patellofemoral compartments of   the knee with a significant synovitis and associated effusion.  The patient had failed months of conservative treatment including medications, injection therapy, activity modification.     PROCEDURE:  Right total knee replacement.      COMPONENTS USED:  DePuy Attune rotating platform posterior stabilized knee   system, a size 4 femur, 5 tibia, size 5 mm PS AOX insert, and 38 anatomic patellar   button.      SURGEON:  Pietro Cassis. Alvan Dame, M.D.      ASSISTANT:  Griffith Citron, PA-C.      ANESTHESIA:  Regional and Spinal.      SPECIMENS:  None.      COMPLICATION:  None.      DRAINS:  None.  EBL: <100 cc      TOURNIQUET TIME:   Total Tourniquet Time Documented: Thigh (Right) - 30 minutes Total: Thigh (Right) - 30 minutes  .      The patient was stable to the recovery room.      INDICATION FOR PROCEDURE:  Hunter Stone is a 69 y.o. male patient of   mine.  The patient had been seen, evaluated, and treated for months conservatively in the   office with medication, activity modification, and injections.  The patient had   radiographic changes of bone-on-bone arthritis with endplate sclerosis and osteophytes noted.  Based on the radiographic changes and failed conservative measures, the patient   decided to proceed with definitive treatment, total knee replacement.  Risks of infection, DVT, component failure,  need for revision surgery, neurovascular injury were reviewed in the office setting.  The postop course was reviewed stressing the efforts to maximize post-operative satisfaction and function.  Consent was obtained for benefit of pain   relief.      PROCEDURE IN DETAIL:  The patient was brought to the operative theater.   Once adequate anesthesia, preoperative antibiotics, 2 gm of Ancef,1 gm of Tranexamic Acid, and 10 mg of Decadron administered, the patient was positioned supine with a right thigh tourniquet placed.  The  right lower extremity was prepped and draped in sterile fashion.  A time-   out was performed identifying the patient, planned procedure, and the appropriate extremity.      The right lower extremity was placed in the [facility] leg holder.  The leg was   exsanguinated, tourniquet elevated to 250 mmHg.  A midline incision was  made followed by median parapatellar arthrotomy.  Following initial   exposure, attention was first directed to the patella.  Precut   measurement was noted to be 25 mm.  I resected down to 14 mm and used a   38 anatomic patellar button to restore patellar height as well as cover the cut surface.      The lug holes were drilled and a metal shim was placed to protect the   patella from retractors and saw blade during the procedure.      At this point, attention was now directed to the femur.  The femoral   canal was opened with a drill, irrigated to try to prevent fat emboli.  An   intramedullary rod was passed at 3 degrees valgus, 9 mm of bone was   resected off the distal femur.  Following this resection, the tibia was   subluxated anteriorly.  Using the extramedullary guide, 2 mm of bone was resected off   the proximal medial tibia.  We confirmed the gap would be   stable medially and laterally with a size 5 spacer block as well as confirmed that the tibial cut was perpendicular in the coronal plane, checking with an alignment rod.      Once this was  done, I sized the femur to be a size 4 in the anterior-   posterior dimension, chose a standard component based on medial and   lateral dimension.  The size 4 rotation block was then pinned in   position anterior referenced using the C-clamp to set rotation.  The   anterior, posterior, and  chamfer cuts were made without difficulty nor   notching making certain that I was along the anterior cortex to help   with flexion gap stability.      The final box cut was made off the lateral aspect of distal femur.      At this point, the tibia was sized to be a size 5.  The size 5 tray was   then pinned in position through the medial third of the tubercle,   drilled, and keel punched.  Trial reduction was now carried with a 4 femur,  5 tibia, a 5 mm PS insert, and the 38 anatomic patella botton.  The knee was brought to full extension with good flexion stability with the patella   tracking through the trochlea without application of pressure.  Given   all these findings the trial components removed.  Final components were   opened and cement was mixed.  The knee was irrigated with normal saline solution and pulse lavage.  The synovial lining was   then injected with 30 cc of 0.25% Marcaine with epinephrine, 1 cc of Toradol and 30 cc of NS for a total of 61 cc.     Final implants were then cemented onto cleaned and dried cut surfaces of bone with the knee brought to extension with a size 5 mm PS trial insert.      Once the cement had fully cured, excess cement was removed   throughout the knee.  I confirmed that I was satisfied with the range of   motion and stability, and the final size 5 mm PS AOX insert was chosen.  It was   placed into the knee.      The tourniquet had been let down at 30 minutes.  No significant   hemostasis was required.  The extensor mechanism was then reapproximated using #1 Vicryl and #  1 Stratafix sutures with the knee   in flexion.  The   remaining wound was closed with  2-0 Vicryl and running 4-0 Monocryl.   The knee was cleaned, dried, dressed sterilely using Dermabond and   Aquacel dressing.  The patient was then   brought to recovery room in stable condition, tolerating the procedure   well.   Please note that Physician Assistant, Griffith Citron, PA-C was present for the entirety of the case, and was utilized for pre-operative positioning, peri-operative retractor management, general facilitation of the procedure and for primary wound closure at the end of the case.              Pietro Cassis Alvan Dame, M.D.    06/28/2020 12:36 PM

## 2020-06-29 DIAGNOSIS — Z79899 Other long term (current) drug therapy: Secondary | ICD-10-CM | POA: Diagnosis not present

## 2020-06-29 DIAGNOSIS — Z7982 Long term (current) use of aspirin: Secondary | ICD-10-CM | POA: Diagnosis not present

## 2020-06-29 DIAGNOSIS — I10 Essential (primary) hypertension: Secondary | ICD-10-CM | POA: Diagnosis not present

## 2020-06-29 DIAGNOSIS — M1711 Unilateral primary osteoarthritis, right knee: Secondary | ICD-10-CM | POA: Diagnosis not present

## 2020-06-29 LAB — BASIC METABOLIC PANEL
Anion gap: 8 (ref 5–15)
BUN: 20 mg/dL (ref 8–23)
CO2: 28 mmol/L (ref 22–32)
Calcium: 9.1 mg/dL (ref 8.9–10.3)
Chloride: 102 mmol/L (ref 98–111)
Creatinine, Ser: 0.96 mg/dL (ref 0.61–1.24)
GFR, Estimated: >60 mL/min (ref >60)
Glucose, Bld: 135 mg/dL — ABNORMAL HIGH (ref 70–99)
Potassium: 4.4 mmol/L (ref 3.5–5.1)
Sodium: 138 mmol/L (ref 135–145)

## 2020-06-29 LAB — CBC
HCT: 34.1 % — ABNORMAL LOW (ref 39.0–52.0)
Hemoglobin: 11.1 g/dL — ABNORMAL LOW (ref 13.0–17.0)
MCH: 31 pg (ref 26.0–34.0)
MCHC: 32.6 g/dL (ref 30.0–36.0)
MCV: 95.3 fL (ref 80.0–100.0)
Platelets: 220 10*3/uL (ref 150–400)
RBC: 3.58 MIL/uL — ABNORMAL LOW (ref 4.22–5.81)
RDW: 13.6 % (ref 11.5–15.5)
WBC: 9 10*3/uL (ref 4.0–10.5)
nRBC: 0 % (ref 0.0–0.2)

## 2020-06-29 MED ORDER — CELECOXIB 200 MG PO CAPS: 200.0000 mg | ORAL_CAPSULE | Freq: Two times a day (BID) | ORAL | 0 refills | Status: AC

## 2020-06-29 MED ORDER — HYDROCODONE-ACETAMINOPHEN 7.5-325 MG PO TABS: 1.0000 | ORAL_TABLET | ORAL | 0 refills | Status: AC | PRN

## 2020-06-29 MED ORDER — FERROUS SULFATE 325 (65 FE) MG PO TABS: 325.0000 mg | ORAL_TABLET | Freq: Three times a day (TID) | ORAL | 0 refills | Status: AC

## 2020-06-29 MED ORDER — METHOCARBAMOL 500 MG PO TABS: 500.0000 mg | ORAL_TABLET | Freq: Four times a day (QID) | ORAL | 0 refills | Status: AC | PRN

## 2020-06-29 MED ORDER — ASPIRIN 81 MG PO CHEW: 81.0000 mg | CHEWABLE_TABLET | Freq: Two times a day (BID) | ORAL | 0 refills | Status: AC

## 2020-06-29 MED ORDER — DOCUSATE SODIUM 100 MG PO CAPS: 100.0000 mg | ORAL_CAPSULE | Freq: Two times a day (BID) | ORAL | 0 refills | Status: AC

## 2020-06-29 MED ORDER — POLYETHYLENE GLYCOL 3350 17 G PO PACK: 17.0000 g | PACK | Freq: Two times a day (BID) | ORAL | 0 refills | Status: AC

## 2020-06-29 NOTE — Progress Notes (Signed)
     Subjective: 1 Day Post-Op Procedure(s) (LRB): TOTAL KNEE ARTHROPLASTY (Right)   Seen by Dr. Alvan Dame. Patient reports pain as mild, pain controlled. No reported events throughout the night.  Dr. Alvan Dame discussed the procedure, findings and expectations moving forward.  Ready to be discharged home, if they do well with PT.  Follow up in the clinic in 2 weeks.  Knows to call with any questions or concerns.     Patient's anticipated LOS is less than 2 midnights, meeting these requirements: - Lives within 1 hour of care - Has a competent adult at home to recover with post-op recover - NO history of  - Chronic pain requiring opiods  - Diabetes  - Coronary Artery Disease  - Heart failure  - Heart attack  - Stroke  - DVT/VTE  - Cardiac arrhythmia  - Respiratory Failure/COPD  - Renal failure  - Anemia  - Advanced Liver disease       Objective:   VITALS:   Vitals:   06/29/20 0224 06/29/20 0558  BP: 107/74 134/81  Pulse: 69 63  Resp: 16 16  Temp: 98.4 F (36.9 C) 98.8 F (37.1 C)  SpO2: 99% 99%    Dorsiflexion/Plantar flexion intact Incision: dressing C/D/I No cellulitis present Compartment soft  LABS Recent Labs    06/29/20 0251  HGB 11.1*  HCT 34.1*  WBC 9.0  PLT 220    Recent Labs    06/29/20 0251  NA 138  K 4.4  BUN 20  CREATININE 0.96  GLUCOSE 135*     Assessment/Plan: 1 Day Post-Op Procedure(s) (LRB): TOTAL KNEE ARTHROPLASTY (Right) Foley cath d/c'ed May need to modify the walker to limit stress through painful arthritic thumb Advance diet Up with therapy D/C IV fluids Discharge home Follow up in 2 weeks at [facility] Follow up with OLIN,Evamarie Raetz D in 2 weeks.  Contact information:  [facility] [address] [place] [phone]     Obese (BMI 30-39.9) Estimated body mass index is 30.53 kg/m as calculated from the following:   Height as of this encounter: 5\' 6"  (1.676 m).   Weight as of  this encounter: 85.8 kg. Patient also counseled that weight may inhibit the healing process Patient counseled that losing weight will help with future health issues       Danae Orleans PA-C  [facility]  [place] [address], [place], [place] [address] Phone: [phone] [email or web address] Facebook  [employer]

## 2020-06-29 NOTE — TOC Transition Note (Signed)
Transition of Care [facility]) - CM/SW Discharge Note   Patient Details  Name: MOHD CLEMONS MRN: [id number] Date of Birth: 06-26-1951  Transition of Care [facility]) CM/SW Contact:  Lennart Pall, LCSW Phone Number: 06/29/2020, 10:22 AM   Clinical Narrative:    Met briefly with pt to confirm needs for rw and 3n1 - [employer] to supply.  Plan for OPPT at [facility]  No further TOC needs.   Final next level of care: OP Rehab Barriers to Discharge: No Barriers Identified   Patient Goals and CMS Choice Patient states their goals for this hospitalization and ongoing recovery are:: go home      Discharge Placement                       Discharge Plan and Services                DME Arranged: Gilford Rile rolling,3-N-1 DME Agency: [employer]                  Social Determinants of Health (SDOH) Interventions     Readmission Risk Interventions No flowsheet data found.

## 2020-06-29 NOTE — Plan of Care (Signed)
Pt completed PT and is ready for DC home with wife

## 2020-06-29 NOTE — Progress Notes (Signed)
Physical Therapy Treatment Patient Details Name: Hunter Stone MRN: [id number] DOB: [birth year] Today's Date: 06/29/2020    History of Present Illness s/p R TKA. PMH: R biceps tendon repair, CTS    PT Comments    POD # 1 am session Assisted OOB. General bed mobility comments: demonstarted and instructed on proper tech using belt to self assist LE.   General transfer comment: 25% VC's on proper hand placement and safety with turns.  General Gait Details: 25% VC's on proper sequencing, proper walker to self distance and safety with turns.  General stair comments: pt has 3 steps no rails.  Instructed on proper tech using walker forward with assist securing walking.  Will repeat when spouse arrives later today.  Then returned to room to perform some TE's following HEP handout.  Instructed on proper tech, freq as well as use of ICE.   Pt will need another PT session to complete TE's and practice stairs with spouse.    Follow Up Recommendations  Follow surgeon's recommendation for DC plan and follow-up therapies     Equipment Recommendations  Rolling walker with 5" wheels;3in1 (PT)    Recommendations for Other Services       Precautions / Restrictions Precautions Precautions: Fall;Knee Precaution Comments: instructed no pillow under knee Restrictions Weight Bearing Restrictions: No Other Position/Activity Restrictions: WBAT    Mobility  Bed Mobility Overal bed mobility: Needs Assistance Bed Mobility: Supine to Sit           General bed mobility comments: demonstarted and instructed on proper tech using belt to self assist LE  Transfers Overall transfer level: Needs assistance Equipment used: Rolling walker (2 wheeled) Transfers: Sit to/from [employer] Transfers Sit to Stand: Supervision;Min guard Stand pivot transfers: Min guard       General transfer comment: 25% VC's on proper hand placement and safety with turns  Ambulation/Gait Ambulation/Gait assistance:  Min guard;Min assist Gait Distance (Feet): 65 Feet Assistive device: Rolling walker (2 wheeled) Gait Pattern/deviations: Step-to pattern Gait velocity: decr   General Gait Details: 25% VC's on proper sequencing, proper walker to self distance and safety with turns   Stairs Stairs: Yes Stairs assistance: Min assist Stair Management: No rails;Step to pattern;Forwards;With walker Number of Stairs: 4 General stair comments: pt has 3 steps no rails.  Instructed on proper tech using walker forward with assist securing walking.  Will repeat when spouse arrives later today   Wheelchair Mobility    Modified Rankin (Stroke Patients Only)       Balance                                            Cognition Arousal/Alertness: Awake/alert Behavior During Therapy: WFL for tasks assessed/performed Overall Cognitive Status: Within Functional Limits for tasks assessed                                 General Comments: AxO x 3 very motivated      Exercises   Total Knee Replacement TE's following HEP handout 10 reps B LE ankle pumps 05 reps towel squeezes 05 reps knee presses 05 reps heel slides  05 reps SAQ's 05 reps SLR's 05 reps ABD Educated on use of gait belt to assist with TE's Followed by ICE     General Comments  Pertinent Vitals/Pain Pain Assessment: 0-10 Pain Score: 5  Pain Location: right knee Pain Descriptors / Indicators: Aching;Discomfort;Operative site guarding;Tightness Pain Intervention(s): Monitored during session;Repositioned;Ice applied    Home Living                      Prior Function            PT Goals (current goals can now be found in the care plan section) Progress towards PT goals: Progressing toward goals    Frequency    7X/week      PT Plan Current plan remains appropriate    Co-evaluation              AM-PAC PT "6 Clicks" Mobility   Outcome Measure  Help needed turning  from your back to your side while in a flat bed without using bedrails?: A Little Help needed moving from lying on your back to sitting on the side of a flat bed without using bedrails?: A Little Help needed moving to and from a bed to a chair (including a wheelchair)?: A Little Help needed standing up from a chair using your arms (e.g., wheelchair or bedside chair)?: A Little Help needed to walk in hospital room?: A Little Help needed climbing 3-5 steps with a railing? : A Little 6 Click Score: 18    End of Session Equipment Utilized During Treatment: Gait belt Activity Tolerance: Patient tolerated treatment well Patient left: in chair;with call bell/phone within reach;with chair alarm set Nurse Communication: Mobility status PT Visit Diagnosis: Other abnormalities of gait and mobility (R26.89)     Time: 0930-1010 PT Time Calculation (min) (ACUTE ONLY): 40 min  Charges:  $Gait Training: 8-22 mins $Therapeutic Exercise: 8-22 mins $Therapeutic Activity: 8-22 mins                     Rica Koyanagi  PTA Acute  Rehabilitation Services Pager      [phone] Office      [phone]

## 2020-06-29 NOTE — Progress Notes (Signed)
Physical Therapy Treatment Patient Details Name: Hunter Stone MRN: [id number] DOB: 15-Feb-1951 Today's Date: 06/29/2020    History of Present Illness s/p R TKA. PMH: R biceps tendon repair, CTS    PT Comments    POD # 1 pm session Spouse present during session for family training on safe handling, stairs and HEP. Addressed all mobility questions, discussed appropriate activity, educated on use of ICE.  Pt ready for D/C to home.   Follow Up Recommendations  Follow surgeons recommendation for DC plan and follow-up therapies     Equipment Recommendations  Rolling walker with 5" wheels;3in1 (PT)    Recommendations for Other Services       Precautions / Restrictions Precautions Precautions: Fall;Knee Precaution Comments: instructed no pillow under knee Restrictions Weight Bearing Restrictions: No Other Position/Activity Restrictions: WBAT    Mobility  Bed Mobility Overal bed mobility: Needs Assistance Bed Mobility: Supine to Sit           General bed mobility comments: OOB in recliner  Transfers Overall transfer level: Needs assistance Equipment used: Rolling walker (2 wheeled) Transfers: Sit to/from [employer] Sit to Stand: Supervision;Min guard Stand pivot transfers: Min guard       General transfer comment: 25% VC's on proper hand placement and safety with turns  Ambulation/Gait Ambulation/Gait assistance: Min guard;Min assist Gait Distance (Feet): 75 Feet Assistive device: Rolling walker (2 wheeled) Gait Pattern/deviations: Step-to pattern Gait velocity: decr   General Gait Details: 25% VC's on proper sequencing, proper walker to self distance and safety with turns   Stairs Stairs: Yes Stairs assistance: Min assist Stair Management: No rails;Step to pattern;Forwards;With walker Number of Stairs: 4 General stair comments: practiced with spouse "hands on" with 50% VC's on proper walker placement and safe handling   Wheelchair  Mobility    Modified Rankin (Stroke Patients Only)       Balance                                            Cognition Arousal/Alertness: Awake/alert Behavior During Therapy: WFL for tasks assessed/performed Overall Cognitive Status: Within Functional Limits for tasks assessed                                 General Comments: AxO x 3 very motivated      Exercises      General Comments        Pertinent Vitals/Pain Pain Assessment: 0-10 Pain Score: 5  Pain Location: right knee Pain Descriptors / Indicators: Aching;Discomfort;Operative site guarding;Tightness Pain Intervention(s): Monitored during session;Repositioned;Ice applied    Home Living                      Prior Function            PT Goals (current goals can now be found in the care plan section) Progress towards PT goals: Progressing toward goals    Frequency    7X/week      PT Plan Current plan remains appropriate    Co-evaluation              AM-PAC PT "6 Clicks" Mobility   Outcome Measure  Help needed turning from your back to your side while in a flat bed without using bedrails?: A Little Help needed moving from lying  on your back to sitting on the side of a flat bed without using bedrails?: A Little Help needed moving to and from a bed to a chair (including a wheelchair)?: A Little Help needed standing up from a chair using your arms (e.g., wheelchair or bedside chair)?: A Little Help needed to walk in hospital room?: A Little Help needed climbing 3-5 steps with a railing? : A Little 6 Click Score: 18    End of Session Equipment Utilized During Treatment: Gait belt Activity Tolerance: Patient tolerated treatment well Patient left: in chair;with call bell/phone within reach;with chair alarm set Nurse Communication: Mobility status PT Visit Diagnosis: Other abnormalities of gait and mobility (R26.89)     Time: 1300-1325 PT Time  Calculation (min) (ACUTE ONLY): 25 min  Charges:  $Gait Training: 8-22 mins $Therapeutic Activity: 8-22 mins                     {Amsi Grimley  PTA Acute  Rehabilitation Services Pager      [phone] Office      [phone]

## 2020-06-30 ENCOUNTER — Encounter (HOSPITAL_COMMUNITY): Payer: Self-pay | Admitting: Orthopedic Surgery

## 2020-06-30 NOTE — Discharge Summary (Signed)
Patient ID: Hunter Stone MRN: [id number] DOB/AGE: [birth year] 69 y.o.  Admit date: 06/28/2020 Discharge date: 06/30/2020  Admission Diagnoses:  Principal Problem:   Osteoarthritis of right knee Active Problems:   Status post total right knee replacement   Discharge Diagnoses:  Same  Past Medical History:  Diagnosis Date   Allergic rhinitis    Anemia    Arthritis    Bladder stones    BPH (benign prostatic hyperplasia)    CTS (carpal tunnel syndrome) 09/01/2014   Right   Depression    Dyslipidemia    Hearing loss    History of anemia    History of gross hematuria    Hypertension    Liver cyst 2017   Small cysts in the liver with a minimally complex cyst    Neck mass    Left   Obesity    OSA (obstructive sleep apnea)    uses oral appliance   Peptic ulcer disease    Pre-diabetes    Tinnitus    Wears glasses     Surgeries: Procedure(s): RIGHT TOTAL KNEE ARTHROPLASTY on 06/28/2020   Consultants: N/A  Discharged Condition: Improved  Hospital Course: OVAL CAVAZOS is an 69 y.o. male who was admitted 06/28/2020 for operative treatment ofOsteoarthritis of right knee. Patient has severe unremitting pain that affects sleep, daily activities, and work/hobbies. After pre-op clearance the patient was taken to the operating room on 06/28/2020 and underwent  Procedure(s): RIGHT TOTAL KNEE ARTHROPLASTY.    Patient was given perioperative antibiotics:  Anti-infectives (From admission, onward)   Start     Dose/Rate Route Frequency Ordered Stop   06/28/20 1700  ceFAZolin (ANCEF) IVPB 2g/100 mL premix        2 g 200 mL/hr over 30 Minutes Intravenous Every 6 hours 06/28/20 1447 06/28/20 2258   06/28/20 0745  ceFAZolin (ANCEF) IVPB 2g/100 mL premix        2 g 200 mL/hr over 30 Minutes Intravenous On call to O.R. 06/28/20 0737 06/28/20 1113       Patient was given sequential compression devices, early ambulation, and chemoprophylaxis to prevent  DVT.  Patient benefited maximally from hospital stay and there were no complications.    Recent vital signs: No data found.   Recent laboratory studies:  Recent Labs    06/29/20 0251  WBC 9.0  HGB 11.1*  HCT 34.1*  PLT 220  NA 138  K 4.4  CL 102  CO2 28  BUN 20  CREATININE 0.96  GLUCOSE 135*  CALCIUM 9.1     Discharge Medications:   Allergies as of 06/29/2020   No Known Allergies     Medication List    STOP taking these medications   aspirin 81 MG tablet Replaced by: aspirin 81 MG chewable tablet   ibuprofen 200 MG tablet Commonly known as: ADVIL   traMADol 50 MG tablet Commonly known as: ULTRAM     TAKE these medications   aspirin 81 MG chewable tablet Commonly known as: Aspirin Childrens Chew 1 tablet (81 mg total) by mouth 2 (two) times daily. Take for 4 weeks, then resume regular dose. Replaces: aspirin 81 MG tablet   celecoxib 200 MG capsule Commonly known as: CeleBREX Take 1 capsule (200 mg total) by mouth 2 (two) times daily.   cetirizine 10 MG tablet Commonly known as: ZYRTEC Take 10 mg by mouth every evening.   docusate sodium 100 MG capsule Commonly known as: Colace Take 1 capsule (100 mg total) by mouth  2 (two) times daily.   ferrous sulfate 325 (65 FE) MG tablet Commonly known as: FerrouSul Take 1 tablet (325 mg total) by mouth 3 (three) times daily with meals for 14 days.   HYDROcodone-acetaminophen 7.5-325 MG tablet Commonly known as: Norco Take 1-2 tablets by mouth every 4 (four) hours as needed for moderate pain.   methocarbamol 500 MG tablet Commonly known as: Robaxin Take 1 tablet (500 mg total) by mouth every 6 (six) hours as needed for muscle spasms.   olmesartan-hydrochlorothiazide 20-12.5 MG tablet Commonly known as: BENICAR HCT Take 1 tablet by mouth every evening.   polyethylene glycol 17 g packet Commonly known as: MIRALAX / GLYCOLAX Take 17 g by mouth 2 (two) times daily.   rosuvastatin 10 MG tablet Commonly  known as: CRESTOR Take 10 mg by mouth every evening.            Discharge Care Instructions  (From admission, onward)         Start     Ordered   06/29/20 0000  Change dressing       Comments: Maintain surgical dressing until follow up in the clinic. If the edges start to pull up, may reinforce with tape. If the dressing is no longer working, may remove and cover with gauze and tape, but must keep the area dry and clean.  Call with any questions or concerns.   06/29/20 0831          Diagnostic Studies: No results found.  Disposition: HOME  Discharge Instructions    Call MD / Call 911   Complete by: As directed    If you experience chest pain or shortness of breath, CALL 911 and be transported to the hospital emergency room.  If you develope a fever above 101 F, pus (white drainage) or increased drainage or redness at the wound, or calf pain, call your surgeon's office.   Change dressing   Complete by: As directed    Maintain surgical dressing until follow up in the clinic. If the edges start to pull up, may reinforce with tape. If the dressing is no longer working, may remove and cover with gauze and tape, but must keep the area dry and clean.  Call with any questions or concerns.   Constipation Prevention   Complete by: As directed    Drink plenty of fluids.  Prune juice may be helpful.  You may use a stool softener, such as Colace (over the counter) 100 mg twice a day.  Use MiraLax (over the counter) for constipation as needed.   Diet - low sodium heart healthy   Complete by: As directed    Discharge instructions   Complete by: As directed    Maintain surgical dressing until follow up in the clinic. If the edges start to pull up, may reinforce with tape. If the dressing is no longer working, may remove and cover with gauze and tape, but must keep the area dry and clean.  Follow up in 2 [facility] at [facility]. Call with any questions or concerns.   Increase activity slowly as  tolerated   Complete by: As directed    Weight bearing as tolerated with assist device (walker, cane, etc) as directed, use it as long as suggested by your surgeon or therapist, typically at least 4-6 weeks.   TED hose   Complete by: As directed    Use stockings (TED hose) for 2 weeks on both leg(s).  You may remove them at night  for sleeping.       Follow-up Information    Paralee Cancel, MD. Go on 07/13/2020.   Specialty: Orthopedic Surgery Why: You are scheduled for first post op appointment on Wednesday December 29th at 9:15am.  Contact information: [address] [place] [address] [phone]        Rosilyn Mings.. Go on 07/01/2020.   Why: You are scheduled for physical therapy eval on Friday December 17th at 3:15pm. Contact information: [place] [address] [phone]                Signed: Lucille Passy [facility] 06/30/2020, 2:43 PM

## 2020-07-01 DIAGNOSIS — M25561 Pain in right knee: Secondary | ICD-10-CM | POA: Diagnosis not present

## 2020-07-05 DIAGNOSIS — M25561 Pain in right knee: Secondary | ICD-10-CM | POA: Diagnosis not present

## 2020-07-07 DIAGNOSIS — M25561 Pain in right knee: Secondary | ICD-10-CM | POA: Diagnosis not present

## 2020-07-12 DIAGNOSIS — M25561 Pain in right knee: Secondary | ICD-10-CM | POA: Diagnosis not present

## 2020-07-14 DIAGNOSIS — M25561 Pain in right knee: Secondary | ICD-10-CM | POA: Diagnosis not present

## 2020-07-20 DIAGNOSIS — M25561 Pain in right knee: Secondary | ICD-10-CM | POA: Diagnosis not present

## 2020-07-22 DIAGNOSIS — M25561 Pain in right knee: Secondary | ICD-10-CM | POA: Diagnosis not present

## 2020-07-25 DIAGNOSIS — M25561 Pain in right knee: Secondary | ICD-10-CM | POA: Diagnosis not present

## 2020-07-29 DIAGNOSIS — M25561 Pain in right knee: Secondary | ICD-10-CM | POA: Diagnosis not present

## 2020-08-05 DIAGNOSIS — M25561 Pain in right knee: Secondary | ICD-10-CM | POA: Diagnosis not present

## 2020-08-09 DIAGNOSIS — M25561 Pain in right knee: Secondary | ICD-10-CM | POA: Diagnosis not present

## 2020-08-10 DIAGNOSIS — Z96651 Presence of right artificial knee joint: Secondary | ICD-10-CM | POA: Diagnosis not present

## 2020-08-10 DIAGNOSIS — Z471 Aftercare following joint replacement surgery: Secondary | ICD-10-CM | POA: Diagnosis not present

## 2020-08-11 DIAGNOSIS — M25561 Pain in right knee: Secondary | ICD-10-CM | POA: Diagnosis not present

## 2020-08-16 DIAGNOSIS — M25561 Pain in right knee: Secondary | ICD-10-CM | POA: Diagnosis not present

## 2020-08-19 DIAGNOSIS — M25561 Pain in right knee: Secondary | ICD-10-CM | POA: Diagnosis not present

## 2020-08-23 DIAGNOSIS — M25561 Pain in right knee: Secondary | ICD-10-CM | POA: Diagnosis not present

## 2020-08-25 DIAGNOSIS — M25561 Pain in right knee: Secondary | ICD-10-CM | POA: Diagnosis not present

## 2020-09-02 DIAGNOSIS — F330 Major depressive disorder, recurrent, mild: Secondary | ICD-10-CM | POA: Diagnosis not present

## 2020-09-02 DIAGNOSIS — G4700 Insomnia, unspecified: Secondary | ICD-10-CM | POA: Diagnosis not present

## 2020-09-08 DIAGNOSIS — M654 Radial styloid tenosynovitis [de Quervain]: Secondary | ICD-10-CM | POA: Diagnosis not present

## 2020-09-08 DIAGNOSIS — M25511 Pain in right shoulder: Secondary | ICD-10-CM | POA: Diagnosis not present

## 2020-09-08 DIAGNOSIS — M25532 Pain in left wrist: Secondary | ICD-10-CM | POA: Diagnosis not present

## 2020-09-08 DIAGNOSIS — M25531 Pain in right wrist: Secondary | ICD-10-CM | POA: Diagnosis not present

## 2020-10-05 DIAGNOSIS — M19012 Primary osteoarthritis, left shoulder: Secondary | ICD-10-CM | POA: Diagnosis not present

## 2020-10-14 DIAGNOSIS — M654 Radial styloid tenosynovitis [de Quervain]: Secondary | ICD-10-CM | POA: Diagnosis not present

## 2020-11-23 DIAGNOSIS — M7662 Achilles tendinitis, left leg: Secondary | ICD-10-CM | POA: Diagnosis not present

## 2020-11-25 DIAGNOSIS — M79672 Pain in left foot: Secondary | ICD-10-CM | POA: Diagnosis not present

## 2020-12-04 DIAGNOSIS — Z23 Encounter for immunization: Secondary | ICD-10-CM | POA: Diagnosis not present

## 2020-12-06 DIAGNOSIS — M79672 Pain in left foot: Secondary | ICD-10-CM | POA: Diagnosis not present

## 2020-12-09 DIAGNOSIS — M79672 Pain in left foot: Secondary | ICD-10-CM | POA: Diagnosis not present

## 2020-12-14 DIAGNOSIS — M79672 Pain in left foot: Secondary | ICD-10-CM | POA: Diagnosis not present

## 2020-12-19 DIAGNOSIS — E782 Mixed hyperlipidemia: Secondary | ICD-10-CM | POA: Diagnosis not present

## 2020-12-31 IMAGING — CT CT HEART MORP W/ CTA COR W/ SCORE W/ CA W/CM &/OR W/O CM
4 of 7 series · 8 of 20 positions shown, 9 images · IV contrast (APPLIED)
Comparison: Chest radiograph 08/18/2010.

Addendum:
EXAM:
Cardiac/Coronary  CTA

68 year old male with dyspnea, which may be anginal equivalent
TECHNIQUE: The patient was scanned on a [employer] Force scanner.

[Series 6: best diast 73 % · axial · 0.39mm/px · z∈[+1174,+1212]mm · 2 of 288 slices shown, 3 images]
[im 96/288  vessel]
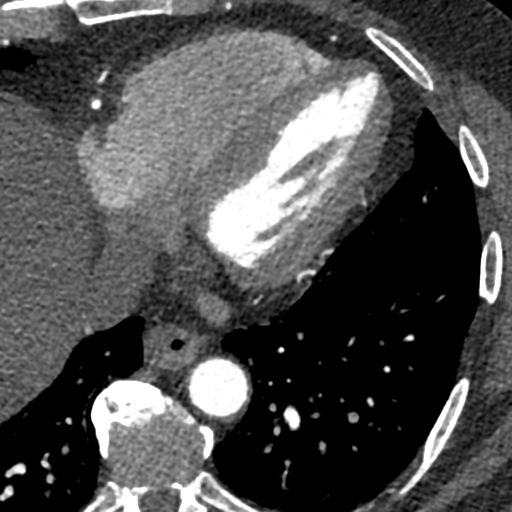
[im 96/288  lung]
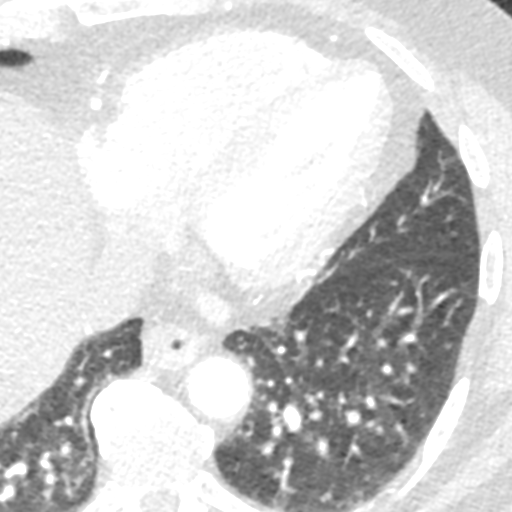
[im 192/288  vessel]
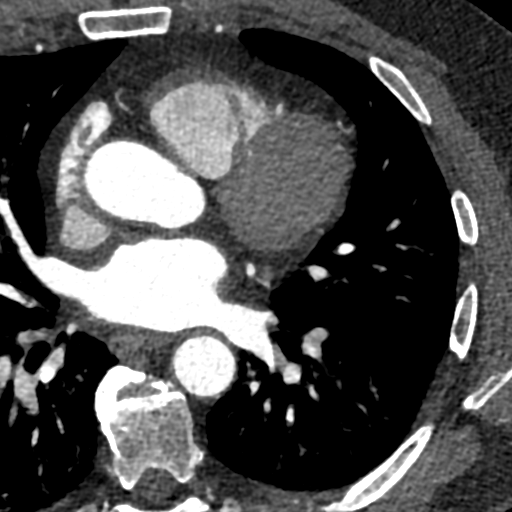

[Series 7: best syst 38 % · axial · 0.39mm/px · z∈[+1174,+1212]mm · 2 of 288 slices shown]
[im 96/288  vessel]
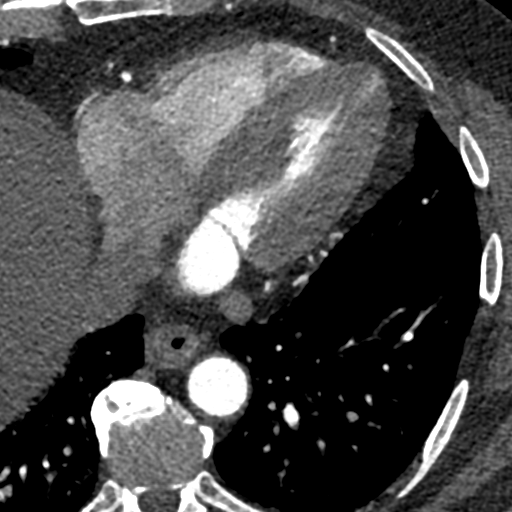
[im 192/288  vessel]
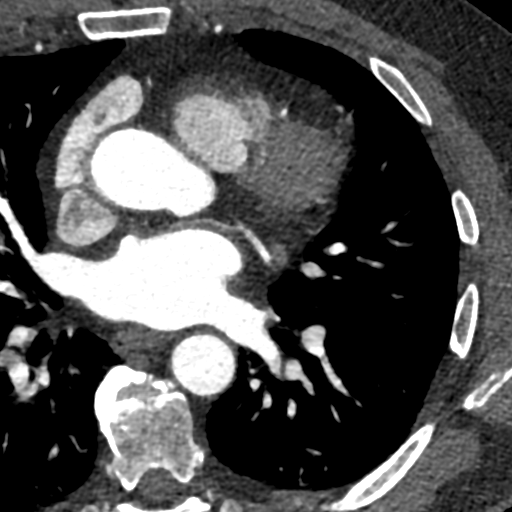

[Series 8: ts diast sharp 73 % · axial · 0.39mm/px · z∈[+1174,+1212]mm · 2 of 288 slices shown]
[im 96/288  lung]
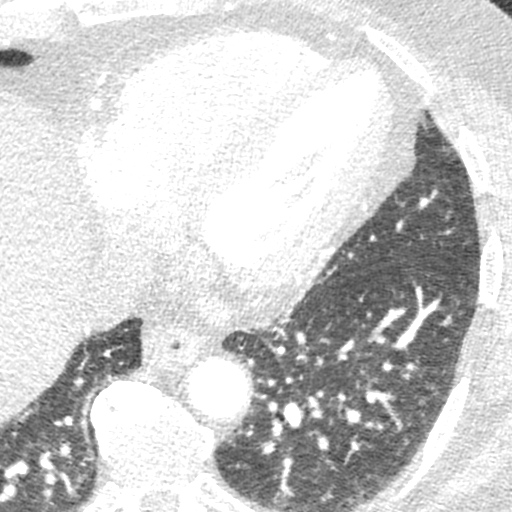
[im 192/288  lung]
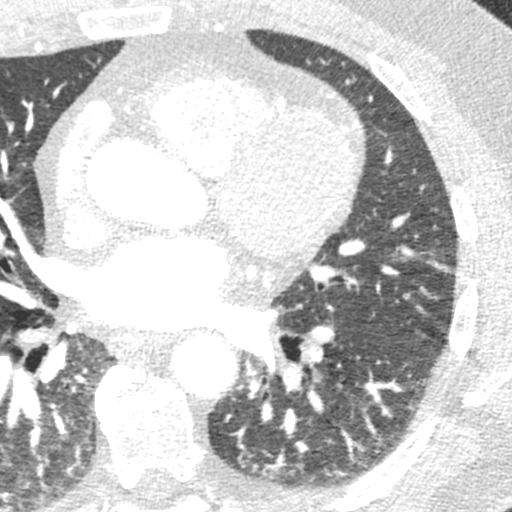

[Series 9: ts syst sharp 38 % · axial · 0.39mm/px · z∈[+1174,+1212]mm · 2 of 288 slices shown]
[im 96/288  lung]
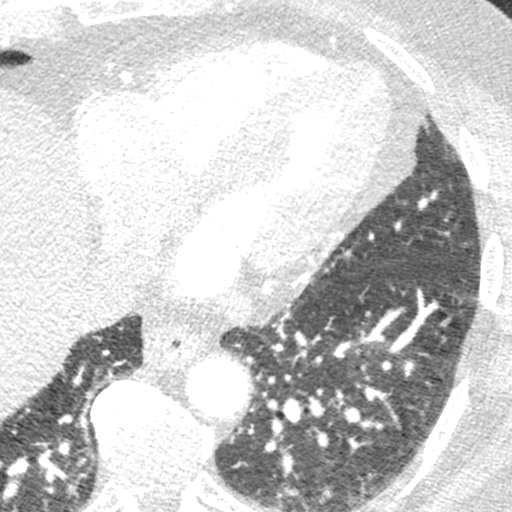
[im 192/288  lung]
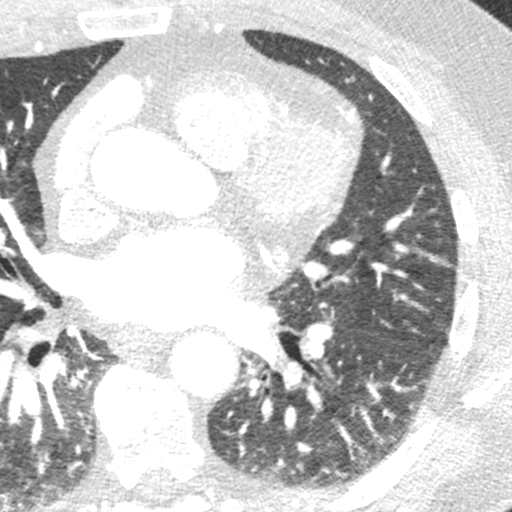

[8 of 20 positions shown; findings below may reference images not displayed]

FINDINGS: A 100 kV prospective scan was triggered in the descending thoracic
aorta at 111 HU's. Axial non-contrast 3 mm slices were carried out
through the heart. The data set was analyzed on a dedicated work
station and scored using the Agatson method. Gantry rotation speed
was 250 msecs and collimation was .6 mm. Beta blockade and 0.8 mg of
sl NTG was given. The 3D data set was reconstructed in 5% intervals
of the 67-82 % of the R-R cycle. Diastolic phases were analyzed on a
dedicated work station using MPR, MIP and VRT modes. The patient
received 80 cc of contrast.

Aorta:  Normal size.  No calcifications.  No dissection.

Aortic Valve:  Trileaflet.  No calcifications.

Coronary Arteries:  Normal coronary origin.  Right dominance.

RCA is a large dominant artery that gives rise to PDA and PLA. There
is focal calcified plaque proximally (0-24%).

Left main is a large artery that gives rise to LAD, Ramus and LCX
arteries. There is focal proximal calcified plaque that is non flow
limiting (0-24% stenosis)

LAD is a large vessel that has proximal and mid scattered calcified
plaque that appears non flow limiting (0-24%). There is a mid to
distal small region of segment myocardial bridging.

Ramus is small caliber branch.

LCX is a non-dominant artery that gives rise to one large OM1
branch. There is small focal non flow limiting calcified plaque
(0-24%).

Other findings:

Normal pulmonary vein drainage into the left atrium.

Normal left atrial appendage without a thrombus.

Normal size of the pulmonary artery.

Please see radiology report for non cardiac findings.
IMPRESSION: 1. Coronary calcium score of 121. This was 50 percentile for age and
sex matched control.

2. Normal coronary origin with right dominance.

3. LAD is a large vessel that has proximal and mid scattered
calcified plaque that appears non flow limiting (0-24%). There is a
mid to distal small region of segment myocardial bridging. RCA is a
large dominant artery that gives rise to PDA and PLA. There is focal
calcified plaque proximally (0-24%).

4. CAD-RADS 1. Minimal non-obstructive CAD (0-24%). Consider
non-atherosclerotic causes of chest pain. Consider preventive
therapy and risk factor modification.

ADDENDUM:
OVER-READ INTERPRETATION  CT CHEST

The following report is an over-read performed by radiologist Dr.
does not include interpretation of cardiac or coronary anatomy or
pathology. The coronary CTA interpretation by the cardiologist is
attached.
FINDINGS: No pleural fluid.  Right middle lobe 4 mm pulmonary nodule on [DATE].

Normal aortic caliber. Aortic atherosclerosis. No central pulmonary
embolism, on this non-dedicated study.

No imaged thoracic adenopathy.

High right hepatic lobe 1.6 cm low-density lesion is favored to
represent a cyst. left hepatic lobe lesions are too small to
characterize. Normal imaged portions of the spleen, stomach.

No acute osseous abnormality.  Upper thoracic vertebral hemangioma.
IMPRESSION: 1.  No acute findings in the imaged extracardiac chest.
2. 4 mm right middle lobe pulmonary nodule. No follow-up needed if
patient is low-risk. Non-contrast chest CT can be considered in 12
months if patient is high-risk. This recommendation follows the
consensus statement: Guidelines for Management of Incidental
Pulmonary Nodules Detected on CT Images: From the [HOSPITAL]
3. Multiple liver lesions. The largest is fluid density, likely a
cyst. Other smaller lesions are too small to characterize. If any
history of primary malignancy or liver disease, consider dedicated
pre and post contrast abdominal MRI.

*** End of Addendum ***
EXAM:
Cardiac/Coronary  CTA

68 year old male with dyspnea, which may be anginal equivalent
FINDINGS: A 100 kV prospective scan was triggered in the descending thoracic
aorta at 111 HU's. Axial non-contrast 3 mm slices were carried out
through the heart. The data set was analyzed on a dedicated work
station and scored using the Agatson method. Gantry rotation speed
was 250 msecs and collimation was .6 mm. Beta blockade and 0.8 mg of
sl NTG was given. The 3D data set was reconstructed in 5% intervals
of the 67-82 % of the R-R cycle. Diastolic phases were analyzed on a
dedicated work station using MPR, MIP and VRT modes. The patient
received 80 cc of contrast.

Aorta:  Normal size.  No calcifications.  No dissection.

Aortic Valve:  Trileaflet.  No calcifications.

Coronary Arteries:  Normal coronary origin.  Right dominance.

RCA is a large dominant artery that gives rise to PDA and PLA. There
is focal calcified plaque proximally (0-24%).

Left main is a large artery that gives rise to LAD, Ramus and LCX
arteries. There is focal proximal calcified plaque that is non flow
limiting (0-24% stenosis)

LAD is a large vessel that has proximal and mid scattered calcified
plaque that appears non flow limiting (0-24%). There is a mid to
distal small region of segment myocardial bridging.

Ramus is small caliber branch.

LCX is a non-dominant artery that gives rise to one large OM1
branch. There is small focal non flow limiting calcified plaque
(0-24%).

Other findings:

Normal pulmonary vein drainage into the left atrium.

Normal left atrial appendage without a thrombus.

Normal size of the pulmonary artery.

Please see radiology report for non cardiac findings.
IMPRESSION: 1. Coronary calcium score of 121. This was 50 percentile for age and
sex matched control.

2. Normal coronary origin with right dominance.

3. LAD is a large vessel that has proximal and mid scattered
calcified plaque that appears non flow limiting (0-24%). There is a
mid to distal small region of segment myocardial bridging. RCA is a
large dominant artery that gives rise to PDA and PLA. There is focal
calcified plaque proximally (0-24%).

4. CAD-RADS 1. Minimal non-obstructive CAD (0-24%). Consider
non-atherosclerotic causes of chest pain. Consider preventive
therapy and risk factor modification.

## 2021-01-04 DIAGNOSIS — M7662 Achilles tendinitis, left leg: Secondary | ICD-10-CM | POA: Diagnosis not present

## 2021-01-04 DIAGNOSIS — M25572 Pain in left ankle and joints of left foot: Secondary | ICD-10-CM | POA: Diagnosis not present

## 2021-01-26 DIAGNOSIS — M25572 Pain in left ankle and joints of left foot: Secondary | ICD-10-CM | POA: Diagnosis not present

## 2021-02-06 DIAGNOSIS — M6702 Short Achilles tendon (acquired), left ankle: Secondary | ICD-10-CM | POA: Diagnosis not present

## 2021-02-06 DIAGNOSIS — M7662 Achilles tendinitis, left leg: Secondary | ICD-10-CM | POA: Diagnosis not present

## 2021-02-06 DIAGNOSIS — T8484XA Pain due to internal orthopedic prosthetic devices, implants and grafts, initial encounter: Secondary | ICD-10-CM | POA: Diagnosis not present

## 2021-03-07 ENCOUNTER — Encounter: Payer: Self-pay | Admitting: Dermatology

## 2021-03-07 ENCOUNTER — Other Ambulatory Visit: Payer: Self-pay

## 2021-03-07 ENCOUNTER — Ambulatory Visit (INDEPENDENT_AMBULATORY_CARE_PROVIDER_SITE_OTHER): Payer: Medicare Other | Admitting: Dermatology

## 2021-03-07 DIAGNOSIS — C4441 Basal cell carcinoma of skin of scalp and neck: Secondary | ICD-10-CM

## 2021-03-07 DIAGNOSIS — D485 Neoplasm of uncertain behavior of skin: Secondary | ICD-10-CM

## 2021-03-07 DIAGNOSIS — Z1283 Encounter for screening for malignant neoplasm of skin: Secondary | ICD-10-CM | POA: Diagnosis not present

## 2021-03-07 DIAGNOSIS — L815 Leukoderma, not elsewhere classified: Secondary | ICD-10-CM

## 2021-03-07 DIAGNOSIS — D3617 Benign neoplasm of peripheral nerves and autonomic nervous system of trunk, unspecified: Secondary | ICD-10-CM

## 2021-03-07 NOTE — Patient Instructions (Signed)

## 2021-03-13 ENCOUNTER — Telehealth: Payer: Self-pay | Admitting: *Deleted

## 2021-03-13 NOTE — Telephone Encounter (Signed)
Path to patient. Patient would like to go to skin surgery center to have the [place] surgery. Ok per Dr.Tafeen. Notes and pathology sent to skin surgery center.

## 2021-03-13 NOTE — Telephone Encounter (Signed)
-----   Message from Lavonna Monarch, MD sent at 03/10/2021  6:47 PM EDT ----- Schedule surgery with Dr. Darene Lamer

## 2021-03-19 ENCOUNTER — Encounter: Payer: Self-pay | Admitting: Dermatology

## 2021-03-19 NOTE — Progress Notes (Signed)
   Follow-Up Visit   Subjective  Hunter Stone is a 70 y.o. male who presents for the following: Annual Exam (No new concerns- left chest- mole gets irritated).  General skin check, check spots on left forehead and chest Location:  Duration:  Quality:  Associated Signs/Symptoms: Modifying Factors:  Severity:  Timing: Context:   Objective  Well appearing patient in no apparent distress; mood and affect are within normal limits. Mid Back Full body skin exam: No atypical pigmented lesions.  Possible BCC scalp and chest will be biopsied.  Left Arm, Right Arm Hypopigmented for millimeter macules arms and legs  Mid Frontal Scalp Lesion actually on her forehead.  5 mm pearly papule suggestive of BCC.     left chest Pearly soft pink 6 mm exophytic papule; favor solitary neurofibroma over BCC.       A full examination was performed including scalp, head, eyes, ears, nose, lips, neck, chest, axillae, abdomen, back, buttocks, bilateral upper extremities, bilateral lower extremities, hands, feet, fingers, toes, fingernails, and toenails. All findings within normal limits unless otherwise noted below.  Areas beneath undergarments not fully examined.   Assessment & Plan    Screening exam for skin cancer Mid Back  Annual skin examination, encouraged to self examine twice annually.  Guttate hypomelanosis (2) Left Arm; Right Arm  Stable no intervention necessary  Neoplasm of uncertain behavior of skin (2) Mid Frontal Scalp  Skin / nail biopsy Type of biopsy: tangential   Informed consent: discussed and consent obtained   Timeout: patient name, date of birth, surgical site, and procedure verified   Anesthesia: the lesion was anesthetized in a standard fashion   Anesthetic:  1% lidocaine w/ epinephrine 1-100,000 local infiltration Instrument used: flexible razor blade   Hemostasis achieved with: aluminum chloride and electrodesiccation   Outcome: patient tolerated  procedure well   Post-procedure details: wound care instructions given    Specimen 1 - Surgical pathology Differential Diagnosis: bcc scc  Check Margins: No  left chest  Skin / nail biopsy Type of biopsy: tangential   Informed consent: discussed and consent obtained   Timeout: patient name, date of birth, surgical site, and procedure verified   Anesthesia: the lesion was anesthetized in a standard fashion   Anesthetic:  1% lidocaine w/ epinephrine 1-100,000 local infiltration Instrument used: flexible razor blade   Hemostasis achieved with: aluminum chloride and electrodesiccation   Outcome: patient tolerated procedure well   Post-procedure details: wound care instructions given    Specimen 2 - Surgical pathology Differential Diagnosis: neurofibroma   Check Margins: No      I, Lavonna Monarch, MD, have reviewed all documentation for this visit.  The documentation on 03/19/21 for the exam, diagnosis, procedures, and orders are all accurate and complete.

## 2021-03-19 NOTE — Addendum Note (Signed)
Addended by: Lavonna Monarch on: 03/19/2021 06:12 AM   Modules accepted: Level of Service

## 2021-04-06 ENCOUNTER — Ambulatory Visit (INDEPENDENT_AMBULATORY_CARE_PROVIDER_SITE_OTHER): Payer: Medicare Other | Admitting: Dermatology

## 2021-04-06 ENCOUNTER — Other Ambulatory Visit: Payer: Self-pay

## 2021-04-06 DIAGNOSIS — C4441 Basal cell carcinoma of skin of scalp and neck: Secondary | ICD-10-CM | POA: Diagnosis not present

## 2021-04-06 DIAGNOSIS — L988 Other specified disorders of the skin and subcutaneous tissue: Secondary | ICD-10-CM | POA: Diagnosis not present

## 2021-04-06 NOTE — Patient Instructions (Signed)

## 2021-04-07 DIAGNOSIS — Z23 Encounter for immunization: Secondary | ICD-10-CM | POA: Diagnosis not present

## 2021-04-13 ENCOUNTER — Other Ambulatory Visit: Payer: Self-pay

## 2021-04-13 ENCOUNTER — Ambulatory Visit (INDEPENDENT_AMBULATORY_CARE_PROVIDER_SITE_OTHER): Payer: Medicare Other | Admitting: *Deleted

## 2021-04-13 DIAGNOSIS — Z4802 Encounter for removal of sutures: Secondary | ICD-10-CM

## 2021-04-13 NOTE — Progress Notes (Signed)
Here for NTS suture removal. No signs or symptoms of infection. Path to patient. 

## 2021-04-22 ENCOUNTER — Encounter: Payer: Self-pay | Admitting: Dermatology

## 2021-04-22 NOTE — Progress Notes (Signed)
   Follow-Up Visit   Subjective  JACKSON COFFIELD is a 70 y.o. male who presents for the following: Procedure (Bcc scalp ).  BCC scalp Location:  Duration:  Quality:  Associated Signs/Symptoms: Modifying Factors:  Severity:  Timing: Context:   Objective  Well appearing patient in no apparent distress; mood and affect are within normal limits. Mid Frontal Scalp Lesion identified by Dr.Belenda Alviar and nurse in room.   5-0 ethilon only x 3    A focused examination was performed including head and neck. Relevant physical exam findings are noted in the Assessment and Plan.   Assessment & Plan    Basal cell carcinoma (BCC) of scalp Mid Frontal Scalp  Destruction of lesion Complexity: simple   Destruction method: electrodesiccation and curettage   Informed consent: discussed and consent obtained   Timeout:  patient name, date of birth, surgical site, and procedure verified Anesthesia: the lesion was anesthetized in a standard fashion   Anesthetic:  1% lidocaine w/ epinephrine 1-100,000 local infiltration Curettage performed in three different directions: Yes   Electrodesiccation performed over the curetted area: Yes   Curettage cycles:  3 Hemostasis achieved with:  aluminum chloride Outcome: patient tolerated procedure well with no complications   Post-procedure details: wound care instructions given    Skin excision  Lesion length (cm):  1 Lesion width (cm):  0.7 Margin per side (cm):  0.1 Total excision diameter (cm):  1.2 Informed consent: discussed and consent obtained   Timeout: patient name, date of birth, surgical site, and procedure verified   Anesthesia: the lesion was anesthetized in a standard fashion   Anesthetic:  1% lidocaine w/ epinephrine 1-100,000 local infiltration Instrument used: #15 blade   Hemostasis achieved with: pressure and electrodesiccation   Outcome: patient tolerated procedure well with no complications   Post-procedure details: sterile  dressing applied and wound care instructions given   Dressing type: bandage, petrolatum and pressure dressing    Specimen 1 - Surgical pathology Differential Diagnosis: bcc superior margin stained  Check Margins: yes   Curettage disclosed this to be more deep than wide; after triple curettage and cautery of the base, narrow margin excision with simple closure performed      I, Lavonna Monarch, MD, have reviewed all documentation for this visit.  The documentation on 04/22/21 for the exam, diagnosis, procedures, and orders are all accurate and complete.

## 2021-05-09 DIAGNOSIS — Z23 Encounter for immunization: Secondary | ICD-10-CM | POA: Diagnosis not present

## 2021-06-21 DIAGNOSIS — Z96651 Presence of right artificial knee joint: Secondary | ICD-10-CM | POA: Diagnosis not present

## 2021-07-21 DIAGNOSIS — H2513 Age-related nuclear cataract, bilateral: Secondary | ICD-10-CM | POA: Diagnosis not present

## 2021-08-01 DIAGNOSIS — G5712 Meralgia paresthetica, left lower limb: Secondary | ICD-10-CM | POA: Diagnosis not present

## 2021-08-01 DIAGNOSIS — N401 Enlarged prostate with lower urinary tract symptoms: Secondary | ICD-10-CM | POA: Diagnosis not present

## 2021-08-01 DIAGNOSIS — R431 Parosmia: Secondary | ICD-10-CM | POA: Diagnosis not present

## 2021-08-01 DIAGNOSIS — I10 Essential (primary) hypertension: Secondary | ICD-10-CM | POA: Diagnosis not present

## 2021-08-01 DIAGNOSIS — Z8659 Personal history of other mental and behavioral disorders: Secondary | ICD-10-CM | POA: Diagnosis not present

## 2021-08-01 DIAGNOSIS — Z1389 Encounter for screening for other disorder: Secondary | ICD-10-CM | POA: Diagnosis not present

## 2021-08-01 DIAGNOSIS — E782 Mixed hyperlipidemia: Secondary | ICD-10-CM | POA: Diagnosis not present

## 2021-08-01 DIAGNOSIS — R7303 Prediabetes: Secondary | ICD-10-CM | POA: Diagnosis not present

## 2021-08-01 DIAGNOSIS — G4733 Obstructive sleep apnea (adult) (pediatric): Secondary | ICD-10-CM | POA: Diagnosis not present

## 2021-08-01 DIAGNOSIS — Z0000 Encounter for general adult medical examination without abnormal findings: Secondary | ICD-10-CM | POA: Diagnosis not present

## 2021-08-01 DIAGNOSIS — Z125 Encounter for screening for malignant neoplasm of prostate: Secondary | ICD-10-CM | POA: Diagnosis not present

## 2021-08-28 DIAGNOSIS — J0181 Other acute recurrent sinusitis: Secondary | ICD-10-CM | POA: Diagnosis not present

## 2021-12-18 DIAGNOSIS — Z23 Encounter for immunization: Secondary | ICD-10-CM | POA: Diagnosis not present

## 2022-01-11 DIAGNOSIS — M13842 Other specified arthritis, left hand: Secondary | ICD-10-CM | POA: Diagnosis not present

## 2022-01-11 DIAGNOSIS — M79645 Pain in left finger(s): Secondary | ICD-10-CM | POA: Diagnosis not present

## 2022-02-07 DIAGNOSIS — E7800 Pure hypercholesterolemia, unspecified: Secondary | ICD-10-CM | POA: Diagnosis not present

## 2022-05-14 DIAGNOSIS — Z23 Encounter for immunization: Secondary | ICD-10-CM | POA: Diagnosis not present

## 2022-06-13 DIAGNOSIS — J029 Acute pharyngitis, unspecified: Secondary | ICD-10-CM | POA: Diagnosis not present

## 2022-06-13 DIAGNOSIS — U071 COVID-19: Secondary | ICD-10-CM | POA: Diagnosis not present

## 2022-06-13 DIAGNOSIS — R051 Acute cough: Secondary | ICD-10-CM | POA: Diagnosis not present

## 2022-06-13 DIAGNOSIS — B349 Viral infection, unspecified: Secondary | ICD-10-CM | POA: Diagnosis not present

## 2022-06-13 DIAGNOSIS — R509 Fever, unspecified: Secondary | ICD-10-CM | POA: Diagnosis not present

## 2022-06-13 DIAGNOSIS — R5383 Other fatigue: Secondary | ICD-10-CM | POA: Diagnosis not present

## 2022-07-04 DIAGNOSIS — M79672 Pain in left foot: Secondary | ICD-10-CM | POA: Diagnosis not present

## 2022-08-24 DIAGNOSIS — M6702 Short Achilles tendon (acquired), left ankle: Secondary | ICD-10-CM | POA: Diagnosis not present

## 2022-08-28 DIAGNOSIS — M6208 Separation of muscle (nontraumatic), other site: Secondary | ICD-10-CM | POA: Diagnosis not present

## 2022-08-28 DIAGNOSIS — R7303 Prediabetes: Secondary | ICD-10-CM | POA: Diagnosis not present

## 2022-08-28 DIAGNOSIS — Z8659 Personal history of other mental and behavioral disorders: Secondary | ICD-10-CM | POA: Diagnosis not present

## 2022-08-28 DIAGNOSIS — G4733 Obstructive sleep apnea (adult) (pediatric): Secondary | ICD-10-CM | POA: Diagnosis not present

## 2022-08-28 DIAGNOSIS — Z1331 Encounter for screening for depression: Secondary | ICD-10-CM | POA: Diagnosis not present

## 2022-08-28 DIAGNOSIS — N401 Enlarged prostate with lower urinary tract symptoms: Secondary | ICD-10-CM | POA: Diagnosis not present

## 2022-08-28 DIAGNOSIS — Z0000 Encounter for general adult medical examination without abnormal findings: Secondary | ICD-10-CM | POA: Diagnosis not present

## 2022-08-28 DIAGNOSIS — G5712 Meralgia paresthetica, left lower limb: Secondary | ICD-10-CM | POA: Diagnosis not present

## 2022-08-28 DIAGNOSIS — M1990 Unspecified osteoarthritis, unspecified site: Secondary | ICD-10-CM | POA: Diagnosis not present

## 2022-08-28 DIAGNOSIS — I10 Essential (primary) hypertension: Secondary | ICD-10-CM | POA: Diagnosis not present

## 2022-08-28 DIAGNOSIS — Z125 Encounter for screening for malignant neoplasm of prostate: Secondary | ICD-10-CM | POA: Diagnosis not present

## 2022-08-28 DIAGNOSIS — E782 Mixed hyperlipidemia: Secondary | ICD-10-CM | POA: Diagnosis not present

## 2022-09-07 DIAGNOSIS — M6702 Short Achilles tendon (acquired), left ankle: Secondary | ICD-10-CM | POA: Diagnosis not present

## 2022-09-11 DIAGNOSIS — M7542 Impingement syndrome of left shoulder: Secondary | ICD-10-CM | POA: Diagnosis not present

## 2022-09-11 DIAGNOSIS — M19012 Primary osteoarthritis, left shoulder: Secondary | ICD-10-CM | POA: Diagnosis not present

## 2022-09-25 DIAGNOSIS — R454 Irritability and anger: Secondary | ICD-10-CM | POA: Diagnosis not present

## 2022-10-17 DIAGNOSIS — M25512 Pain in left shoulder: Secondary | ICD-10-CM | POA: Diagnosis not present

## 2022-11-01 DIAGNOSIS — M79641 Pain in right hand: Secondary | ICD-10-CM | POA: Diagnosis not present

## 2022-11-07 ENCOUNTER — Other Ambulatory Visit: Payer: Self-pay

## 2022-11-07 DIAGNOSIS — M720 Palmar fascial fibromatosis [Dupuytren]: Secondary | ICD-10-CM | POA: Diagnosis not present

## 2022-11-07 DIAGNOSIS — R2231 Localized swelling, mass and lump, right upper limb: Secondary | ICD-10-CM | POA: Diagnosis not present

## 2023-04-01 DIAGNOSIS — R7303 Prediabetes: Secondary | ICD-10-CM | POA: Diagnosis not present

## 2023-04-01 DIAGNOSIS — J309 Allergic rhinitis, unspecified: Secondary | ICD-10-CM | POA: Diagnosis not present

## 2023-04-01 DIAGNOSIS — E782 Mixed hyperlipidemia: Secondary | ICD-10-CM | POA: Diagnosis not present

## 2023-04-01 DIAGNOSIS — R454 Irritability and anger: Secondary | ICD-10-CM | POA: Diagnosis not present

## 2023-04-01 DIAGNOSIS — G4733 Obstructive sleep apnea (adult) (pediatric): Secondary | ICD-10-CM | POA: Diagnosis not present

## 2023-04-01 DIAGNOSIS — I10 Essential (primary) hypertension: Secondary | ICD-10-CM | POA: Diagnosis not present

## 2023-04-01 DIAGNOSIS — G5712 Meralgia paresthetica, left lower limb: Secondary | ICD-10-CM | POA: Diagnosis not present

## 2023-04-01 DIAGNOSIS — M1990 Unspecified osteoarthritis, unspecified site: Secondary | ICD-10-CM | POA: Diagnosis not present

## 2023-04-01 DIAGNOSIS — N401 Enlarged prostate with lower urinary tract symptoms: Secondary | ICD-10-CM | POA: Diagnosis not present

## 2023-04-01 DIAGNOSIS — M6208 Separation of muscle (nontraumatic), other site: Secondary | ICD-10-CM | POA: Diagnosis not present

## 2023-05-13 DIAGNOSIS — Z23 Encounter for immunization: Secondary | ICD-10-CM | POA: Diagnosis not present

## 2023-05-16 DIAGNOSIS — M7711 Lateral epicondylitis, right elbow: Secondary | ICD-10-CM | POA: Diagnosis not present

## 2023-05-16 DIAGNOSIS — F4323 Adjustment disorder with mixed anxiety and depressed mood: Secondary | ICD-10-CM | POA: Diagnosis not present

## 2023-06-06 DIAGNOSIS — M6702 Short Achilles tendon (acquired), left ankle: Secondary | ICD-10-CM | POA: Diagnosis not present

## 2023-06-06 DIAGNOSIS — S92352A Displaced fracture of fifth metatarsal bone, left foot, initial encounter for closed fracture: Secondary | ICD-10-CM | POA: Diagnosis not present

## 2023-06-07 DIAGNOSIS — Z23 Encounter for immunization: Secondary | ICD-10-CM | POA: Diagnosis not present

## 2023-06-15 DIAGNOSIS — F4323 Adjustment disorder with mixed anxiety and depressed mood: Secondary | ICD-10-CM | POA: Diagnosis not present

## 2023-06-20 DIAGNOSIS — M13842 Other specified arthritis, left hand: Secondary | ICD-10-CM | POA: Diagnosis not present

## 2023-07-16 DIAGNOSIS — F4323 Adjustment disorder with mixed anxiety and depressed mood: Secondary | ICD-10-CM | POA: Diagnosis not present

## 2023-08-16 DIAGNOSIS — F4323 Adjustment disorder with mixed anxiety and depressed mood: Secondary | ICD-10-CM | POA: Diagnosis not present

## 2023-08-17 DIAGNOSIS — J020 Streptococcal pharyngitis: Secondary | ICD-10-CM | POA: Diagnosis not present

## 2023-08-17 DIAGNOSIS — S0101XA Laceration without foreign body of scalp, initial encounter: Secondary | ICD-10-CM | POA: Diagnosis not present

## 2023-08-17 DIAGNOSIS — H109 Unspecified conjunctivitis: Secondary | ICD-10-CM | POA: Diagnosis not present

## 2023-08-17 DIAGNOSIS — J101 Influenza due to other identified influenza virus with other respiratory manifestations: Secondary | ICD-10-CM | POA: Diagnosis not present

## 2023-08-23 DIAGNOSIS — Z4802 Encounter for removal of sutures: Secondary | ICD-10-CM | POA: Diagnosis not present

## 2023-09-13 DIAGNOSIS — F4323 Adjustment disorder with mixed anxiety and depressed mood: Secondary | ICD-10-CM | POA: Diagnosis not present

## 2023-09-30 DIAGNOSIS — N401 Enlarged prostate with lower urinary tract symptoms: Secondary | ICD-10-CM | POA: Diagnosis not present

## 2023-09-30 DIAGNOSIS — R7303 Prediabetes: Secondary | ICD-10-CM | POA: Diagnosis not present

## 2023-09-30 DIAGNOSIS — Z125 Encounter for screening for malignant neoplasm of prostate: Secondary | ICD-10-CM | POA: Diagnosis not present

## 2023-09-30 DIAGNOSIS — G5712 Meralgia paresthetica, left lower limb: Secondary | ICD-10-CM | POA: Diagnosis not present

## 2023-09-30 DIAGNOSIS — E782 Mixed hyperlipidemia: Secondary | ICD-10-CM | POA: Diagnosis not present

## 2023-09-30 DIAGNOSIS — J309 Allergic rhinitis, unspecified: Secondary | ICD-10-CM | POA: Diagnosis not present

## 2023-09-30 DIAGNOSIS — M1990 Unspecified osteoarthritis, unspecified site: Secondary | ICD-10-CM | POA: Diagnosis not present

## 2023-09-30 DIAGNOSIS — R454 Irritability and anger: Secondary | ICD-10-CM | POA: Diagnosis not present

## 2023-09-30 DIAGNOSIS — I10 Essential (primary) hypertension: Secondary | ICD-10-CM | POA: Diagnosis not present

## 2023-09-30 DIAGNOSIS — G4733 Obstructive sleep apnea (adult) (pediatric): Secondary | ICD-10-CM | POA: Diagnosis not present

## 2023-09-30 DIAGNOSIS — Z0000 Encounter for general adult medical examination without abnormal findings: Secondary | ICD-10-CM | POA: Diagnosis not present

## 2023-09-30 DIAGNOSIS — Z1331 Encounter for screening for depression: Secondary | ICD-10-CM | POA: Diagnosis not present

## 2023-10-14 DIAGNOSIS — F4323 Adjustment disorder with mixed anxiety and depressed mood: Secondary | ICD-10-CM | POA: Diagnosis not present

## 2023-11-13 DIAGNOSIS — F4323 Adjustment disorder with mixed anxiety and depressed mood: Secondary | ICD-10-CM | POA: Diagnosis not present

## 2023-11-28 DIAGNOSIS — M19042 Primary osteoarthritis, left hand: Secondary | ICD-10-CM | POA: Diagnosis not present

## 2023-12-14 DIAGNOSIS — F4323 Adjustment disorder with mixed anxiety and depressed mood: Secondary | ICD-10-CM | POA: Diagnosis not present

## 2024-01-13 DIAGNOSIS — F4323 Adjustment disorder with mixed anxiety and depressed mood: Secondary | ICD-10-CM | POA: Diagnosis not present

## 2024-01-20 DIAGNOSIS — M1812 Unilateral primary osteoarthritis of first carpometacarpal joint, left hand: Secondary | ICD-10-CM | POA: Diagnosis not present

## 2024-02-06 DIAGNOSIS — M13842 Other specified arthritis, left hand: Secondary | ICD-10-CM | POA: Diagnosis not present

## 2024-02-13 DIAGNOSIS — F4323 Adjustment disorder with mixed anxiety and depressed mood: Secondary | ICD-10-CM | POA: Diagnosis not present

## 2024-03-05 DIAGNOSIS — M19042 Primary osteoarthritis, left hand: Secondary | ICD-10-CM | POA: Diagnosis not present

## 2024-03-05 DIAGNOSIS — M1812 Unilateral primary osteoarthritis of first carpometacarpal joint, left hand: Secondary | ICD-10-CM | POA: Diagnosis not present

## 2024-03-05 DIAGNOSIS — M25642 Stiffness of left hand, not elsewhere classified: Secondary | ICD-10-CM | POA: Diagnosis not present

## 2024-03-05 DIAGNOSIS — M13842 Other specified arthritis, left hand: Secondary | ICD-10-CM | POA: Diagnosis not present

## 2024-03-12 DIAGNOSIS — M1812 Unilateral primary osteoarthritis of first carpometacarpal joint, left hand: Secondary | ICD-10-CM | POA: Diagnosis not present

## 2024-03-12 DIAGNOSIS — M25642 Stiffness of left hand, not elsewhere classified: Secondary | ICD-10-CM | POA: Diagnosis not present

## 2024-03-15 DIAGNOSIS — F330 Major depressive disorder, recurrent, mild: Secondary | ICD-10-CM | POA: Diagnosis not present

## 2024-03-19 DIAGNOSIS — M1812 Unilateral primary osteoarthritis of first carpometacarpal joint, left hand: Secondary | ICD-10-CM | POA: Diagnosis not present

## 2024-03-19 DIAGNOSIS — M25642 Stiffness of left hand, not elsewhere classified: Secondary | ICD-10-CM | POA: Diagnosis not present

## 2024-03-25 DIAGNOSIS — M25642 Stiffness of left hand, not elsewhere classified: Secondary | ICD-10-CM | POA: Diagnosis not present

## 2024-03-25 DIAGNOSIS — M1812 Unilateral primary osteoarthritis of first carpometacarpal joint, left hand: Secondary | ICD-10-CM | POA: Diagnosis not present

## 2024-03-26 DIAGNOSIS — Z23 Encounter for immunization: Secondary | ICD-10-CM | POA: Diagnosis not present

## 2024-03-31 DIAGNOSIS — M1812 Unilateral primary osteoarthritis of first carpometacarpal joint, left hand: Secondary | ICD-10-CM | POA: Diagnosis not present

## 2024-03-31 DIAGNOSIS — M25642 Stiffness of left hand, not elsewhere classified: Secondary | ICD-10-CM | POA: Diagnosis not present

## 2024-04-02 DIAGNOSIS — M6208 Separation of muscle (nontraumatic), other site: Secondary | ICD-10-CM | POA: Diagnosis not present

## 2024-04-02 DIAGNOSIS — R7303 Prediabetes: Secondary | ICD-10-CM | POA: Diagnosis not present

## 2024-04-02 DIAGNOSIS — M1990 Unspecified osteoarthritis, unspecified site: Secondary | ICD-10-CM | POA: Diagnosis not present

## 2024-04-02 DIAGNOSIS — R454 Irritability and anger: Secondary | ICD-10-CM | POA: Diagnosis not present

## 2024-04-02 DIAGNOSIS — I10 Essential (primary) hypertension: Secondary | ICD-10-CM | POA: Diagnosis not present

## 2024-04-02 DIAGNOSIS — E782 Mixed hyperlipidemia: Secondary | ICD-10-CM | POA: Diagnosis not present

## 2024-04-02 DIAGNOSIS — N401 Enlarged prostate with lower urinary tract symptoms: Secondary | ICD-10-CM | POA: Diagnosis not present

## 2024-04-02 DIAGNOSIS — J309 Allergic rhinitis, unspecified: Secondary | ICD-10-CM | POA: Diagnosis not present

## 2024-04-02 DIAGNOSIS — G5712 Meralgia paresthetica, left lower limb: Secondary | ICD-10-CM | POA: Diagnosis not present

## 2024-04-02 DIAGNOSIS — G4733 Obstructive sleep apnea (adult) (pediatric): Secondary | ICD-10-CM | POA: Diagnosis not present

## 2024-04-03 DIAGNOSIS — M13842 Other specified arthritis, left hand: Secondary | ICD-10-CM | POA: Diagnosis not present

## 2024-04-14 DIAGNOSIS — F330 Major depressive disorder, recurrent, mild: Secondary | ICD-10-CM | POA: Diagnosis not present

## 2024-04-16 DIAGNOSIS — M1812 Unilateral primary osteoarthritis of first carpometacarpal joint, left hand: Secondary | ICD-10-CM | POA: Diagnosis not present

## 2024-04-16 DIAGNOSIS — M25642 Stiffness of left hand, not elsewhere classified: Secondary | ICD-10-CM | POA: Diagnosis not present

## 2024-04-22 DIAGNOSIS — M25642 Stiffness of left hand, not elsewhere classified: Secondary | ICD-10-CM | POA: Diagnosis not present

## 2024-04-22 DIAGNOSIS — M1812 Unilateral primary osteoarthritis of first carpometacarpal joint, left hand: Secondary | ICD-10-CM | POA: Diagnosis not present

## 2024-05-01 DIAGNOSIS — M13842 Other specified arthritis, left hand: Secondary | ICD-10-CM | POA: Diagnosis not present

## 2024-05-07 DIAGNOSIS — M25642 Stiffness of left hand, not elsewhere classified: Secondary | ICD-10-CM | POA: Diagnosis not present

## 2024-05-07 DIAGNOSIS — M1812 Unilateral primary osteoarthritis of first carpometacarpal joint, left hand: Secondary | ICD-10-CM | POA: Diagnosis not present

## 2024-05-15 DIAGNOSIS — F330 Major depressive disorder, recurrent, mild: Secondary | ICD-10-CM | POA: Diagnosis not present

## 2024-05-18 DIAGNOSIS — M25642 Stiffness of left hand, not elsewhere classified: Secondary | ICD-10-CM | POA: Diagnosis not present

## 2024-05-18 DIAGNOSIS — M1812 Unilateral primary osteoarthritis of first carpometacarpal joint, left hand: Secondary | ICD-10-CM | POA: Diagnosis not present

## 2024-05-25 DIAGNOSIS — M25642 Stiffness of left hand, not elsewhere classified: Secondary | ICD-10-CM | POA: Diagnosis not present

## 2024-05-25 DIAGNOSIS — M1812 Unilateral primary osteoarthritis of first carpometacarpal joint, left hand: Secondary | ICD-10-CM | POA: Diagnosis not present

## 2024-06-02 DIAGNOSIS — M1812 Unilateral primary osteoarthritis of first carpometacarpal joint, left hand: Secondary | ICD-10-CM | POA: Diagnosis not present

## 2024-06-02 DIAGNOSIS — M25642 Stiffness of left hand, not elsewhere classified: Secondary | ICD-10-CM | POA: Diagnosis not present

## 2024-06-08 DIAGNOSIS — M25642 Stiffness of left hand, not elsewhere classified: Secondary | ICD-10-CM | POA: Diagnosis not present

## 2024-06-08 DIAGNOSIS — M1812 Unilateral primary osteoarthritis of first carpometacarpal joint, left hand: Secondary | ICD-10-CM | POA: Diagnosis not present

## 2024-06-09 DIAGNOSIS — M13842 Other specified arthritis, left hand: Secondary | ICD-10-CM | POA: Diagnosis not present

## 2024-06-29 DIAGNOSIS — M25642 Stiffness of left hand, not elsewhere classified: Secondary | ICD-10-CM | POA: Diagnosis not present

## 2024-06-29 DIAGNOSIS — M1812 Unilateral primary osteoarthritis of first carpometacarpal joint, left hand: Secondary | ICD-10-CM | POA: Diagnosis not present
